# Patient Record
Sex: Male | Born: 1985
Health system: Southern US, Community
[De-identification: ages and names within clinical notes are randomized; demographics above are authoritative.]

## PROBLEM LIST (undated history)

## (undated) DIAGNOSIS — J302 Other seasonal allergic rhinitis: Secondary | ICD-10-CM

## (undated) DIAGNOSIS — I10 Essential (primary) hypertension: Secondary | ICD-10-CM

## (undated) DIAGNOSIS — L309 Dermatitis, unspecified: Secondary | ICD-10-CM

## (undated) HISTORY — DX: Dermatitis, unspecified: L30.9

---
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
## 2005-10-17 ENCOUNTER — Emergency Department (HOSPITAL_COMMUNITY): Admission: EM | Admit: 2005-10-17 | Discharge: 2005-10-17 | Payer: Self-pay | Admitting: Emergency Medicine

## 2006-07-30 ENCOUNTER — Emergency Department (HOSPITAL_COMMUNITY): Admission: EM | Admit: 2006-07-30 | Discharge: 2006-07-30 | Payer: Self-pay | Admitting: Emergency Medicine

## 2007-01-22 ENCOUNTER — Emergency Department (HOSPITAL_COMMUNITY): Admission: EM | Admit: 2007-01-22 | Discharge: 2007-01-22 | Payer: Self-pay | Admitting: Emergency Medicine

## 2007-04-18 ENCOUNTER — Emergency Department (HOSPITAL_COMMUNITY): Admission: EM | Admit: 2007-04-18 | Discharge: 2007-04-18 | Payer: Self-pay | Admitting: Emergency Medicine

## 2008-02-26 ENCOUNTER — Emergency Department (HOSPITAL_COMMUNITY): Admission: EM | Admit: 2008-02-26 | Discharge: 2008-02-26 | Payer: Self-pay | Admitting: Emergency Medicine

## 2008-03-29 ENCOUNTER — Emergency Department (HOSPITAL_COMMUNITY): Admission: EM | Admit: 2008-03-29 | Discharge: 2008-03-29 | Payer: Self-pay | Admitting: Emergency Medicine

## 2008-07-12 ENCOUNTER — Emergency Department (HOSPITAL_COMMUNITY): Admission: EM | Admit: 2008-07-12 | Discharge: 2008-07-12 | Payer: Self-pay | Admitting: Emergency Medicine

## 2008-09-29 ENCOUNTER — Emergency Department (HOSPITAL_COMMUNITY): Admission: EM | Admit: 2008-09-29 | Discharge: 2008-09-29 | Payer: Self-pay | Admitting: Obstetrics and Gynecology

## 2009-01-12 ENCOUNTER — Emergency Department (HOSPITAL_COMMUNITY): Admission: EM | Admit: 2009-01-12 | Discharge: 2009-01-12 | Payer: Self-pay | Admitting: Emergency Medicine

## 2010-05-26 ENCOUNTER — Emergency Department (HOSPITAL_COMMUNITY)
Admission: EM | Admit: 2010-05-26 | Discharge: 2010-05-26 | Disposition: A | Discharge status: Home / Self Care | Payer: BC Managed Care – PPO | Attending: Emergency Medicine | Admitting: Emergency Medicine

## 2010-05-26 DIAGNOSIS — R0989 Other specified symptoms and signs involving the circulatory and respiratory systems: Secondary | ICD-10-CM | POA: Insufficient documentation

## 2010-05-26 DIAGNOSIS — J45909 Unspecified asthma, uncomplicated: Secondary | ICD-10-CM | POA: Insufficient documentation

## 2010-05-26 DIAGNOSIS — R0602 Shortness of breath: Secondary | ICD-10-CM | POA: Insufficient documentation

## 2010-05-26 DIAGNOSIS — R0609 Other forms of dyspnea: Secondary | ICD-10-CM | POA: Insufficient documentation

## 2010-08-24 ENCOUNTER — Emergency Department (HOSPITAL_COMMUNITY)
Admission: EM | Admit: 2010-08-24 | Discharge: 2010-08-24 | Disposition: A | Discharge status: Home / Self Care | Payer: BC Managed Care – PPO | Attending: Emergency Medicine | Admitting: Emergency Medicine

## 2010-08-24 DIAGNOSIS — J45909 Unspecified asthma, uncomplicated: Secondary | ICD-10-CM | POA: Insufficient documentation

## 2010-08-24 DIAGNOSIS — R0982 Postnasal drip: Secondary | ICD-10-CM | POA: Insufficient documentation

## 2010-08-24 DIAGNOSIS — J3489 Other specified disorders of nose and nasal sinuses: Secondary | ICD-10-CM | POA: Insufficient documentation

## 2010-08-24 DIAGNOSIS — J069 Acute upper respiratory infection, unspecified: Secondary | ICD-10-CM | POA: Insufficient documentation

## 2010-09-30 ENCOUNTER — Emergency Department (HOSPITAL_COMMUNITY)
Admission: EM | Admit: 2010-09-30 | Discharge: 2010-09-30 | Disposition: A | Discharge status: Home / Self Care | Payer: BC Managed Care – PPO | Attending: Emergency Medicine | Admitting: Emergency Medicine

## 2010-09-30 DIAGNOSIS — R0602 Shortness of breath: Secondary | ICD-10-CM | POA: Insufficient documentation

## 2010-09-30 DIAGNOSIS — Z760 Encounter for issue of repeat prescription: Secondary | ICD-10-CM | POA: Insufficient documentation

## 2010-09-30 DIAGNOSIS — J45909 Unspecified asthma, uncomplicated: Secondary | ICD-10-CM | POA: Insufficient documentation

## 2010-11-14 ENCOUNTER — Inpatient Hospital Stay (INDEPENDENT_AMBULATORY_CARE_PROVIDER_SITE_OTHER)
Admission: RE | Admit: 2010-11-14 | Discharge: 2010-11-14 | Disposition: A | Discharge status: Home / Self Care | Payer: BC Managed Care – PPO | Source: Ambulatory Visit | Attending: Family Medicine | Admitting: Family Medicine

## 2010-11-14 DIAGNOSIS — J45909 Unspecified asthma, uncomplicated: Secondary | ICD-10-CM

## 2010-12-22 ENCOUNTER — Inpatient Hospital Stay (INDEPENDENT_AMBULATORY_CARE_PROVIDER_SITE_OTHER)
Admission: RE | Admit: 2010-12-22 | Discharge: 2010-12-22 | Disposition: A | Discharge status: Home / Self Care | Payer: BC Managed Care – PPO | Source: Ambulatory Visit | Attending: Family Medicine | Admitting: Family Medicine

## 2010-12-22 DIAGNOSIS — K2990 Gastroduodenitis, unspecified, without bleeding: Secondary | ICD-10-CM

## 2010-12-22 LAB — POCT H PYLORI SCREEN: H. PYLORI SCREEN, POC: NEGATIVE

## 2010-12-22 LAB — LIPASE, BLOOD: Lipase: 18 U/L (ref 11–59)

## 2010-12-22 LAB — AMYLASE: Amylase: 47 U/L (ref 0–105)

## 2011-01-02 LAB — POCT I-STAT 3, ART BLOOD GAS (G3+)
Acid-base deficit: 2
O2 Saturation: 96
Patient temperature: 97.8
pO2, Arterial: 88

## 2011-02-04 ENCOUNTER — Emergency Department (INDEPENDENT_AMBULATORY_CARE_PROVIDER_SITE_OTHER)
Admission: EM | Admit: 2011-02-04 | Discharge: 2011-02-04 | Disposition: A | Discharge status: Home / Self Care | Payer: BC Managed Care – PPO | Source: Home / Self Care | Attending: Family Medicine | Admitting: Family Medicine

## 2011-02-04 DIAGNOSIS — J45909 Unspecified asthma, uncomplicated: Secondary | ICD-10-CM

## 2011-02-04 MED ORDER — ALBUTEROL SULFATE (5 MG/ML) 0.5% IN NEBU
5.0000 mg | INHALATION_SOLUTION | Freq: Once | RESPIRATORY_TRACT | Status: AC
  Administered 2011-02-04: 5 mg via RESPIRATORY_TRACT

## 2011-02-04 MED ORDER — IPRATROPIUM BROMIDE 0.02 % IN SOLN
0.5000 mg | Freq: Once | RESPIRATORY_TRACT | Status: AC
  Administered 2011-02-04: 0.5 mg via RESPIRATORY_TRACT

## 2011-02-04 MED ORDER — ALBUTEROL SULFATE HFA 108 (90 BASE) MCG/ACT IN AERS: 2.0000 | INHALATION_SPRAY | RESPIRATORY_TRACT | Status: DC | PRN

## 2011-02-04 MED ORDER — PREDNISONE 10 MG PO TABS: 20.0000 mg | ORAL_TABLET | Freq: Two times a day (BID) | ORAL | Status: AC

## 2011-02-04 NOTE — ED Notes (Signed)
Pt is having asthma flare-up and chest tightness

## 2011-02-04 NOTE — ED Provider Notes (Signed)
History     CSN: [id number] Arrival date & time: 02/04/2011 10:17 AM   First MD Initiated Contact with Patient 02/04/11 [phone]      Chief Complaint  Patient presents with  . Asthma    (Consider location/radiation/quality/duration/timing/severity/associated sxs/prior treatment) HPI Comments: Chest tightness and wheezing - onset 2 days ago. Has been using albuterol inhaler and relieves symptoms for approx 1 hr only. Cough is productive with yellow phlegm. Minimal nasal congestion. No fever or chills.   Patient is a 25 y.o. male presenting with asthma. The history is provided by the patient.  Asthma This is a recurrent problem. The current episode started 2 days ago. The problem occurs constantly. The problem has not changed since onset.Associated symptoms include shortness of breath. Pertinent negatives include no chest pain. The symptoms are aggravated by nothing. The symptoms are relieved by medications.    Past Medical History  Diagnosis Date  . Asthma     History reviewed. No pertinent past surgical history.  History reviewed. No pertinent family history.  History  Substance Use Topics  . Smoking status: Never Smoker   . Smokeless tobacco: Not on file  . Alcohol Use: 2.4 oz/week    4 Cans of beer per week      Review of Systems  Constitutional: Negative for fever, chills and fatigue.  HENT: Positive for congestion. Negative for ear pain, sore throat, rhinorrhea, sneezing, postnasal drip and sinus pressure.   Respiratory: Positive for cough, chest tightness, shortness of breath and wheezing.   Cardiovascular: Negative for chest pain and palpitations.    Allergies  Review of patient's allergies indicates no known allergies.  Home Medications   Current Outpatient Rx  Name Route Sig Dispense Refill  . ALBUTEROL SULFATE HFA 108 (90 BASE) MCG/ACT IN AERS Inhalation Inhale 2 puffs into the lungs every 6 (six) hours as needed.        BP 141/87  Pulse 87  Temp(Src)  98.5 F (36.9 C) (Oral)  Resp 18  SpO2 97%  Physical Exam  Constitutional: He appears well-developed and well-nourished. No distress.  HENT:  Head: Normocephalic and atraumatic.  Right Ear: External ear normal.  Left Ear: External ear normal.  Mouth/Throat: Oropharynx is clear and moist. No oropharyngeal exudate.  Neck: Neck supple.  Cardiovascular: Normal rate, regular rhythm and normal heart sounds.  Exam reveals no gallop and no friction rub.   No murmur heard. Pulmonary/Chest: Effort normal. No respiratory distress. He has decreased breath sounds. He has wheezes. He has no rhonchi. He has no rales.  Lymphadenopathy:    He has no cervical adenopathy.  Psychiatric: He has a normal mood and affect. His behavior is normal.    ED Course  Procedures (including critical care time)  Labs Reviewed - No data to display No results found.   No diagnosis found.    MDM  After NMT breath sounds and wheezing improved. Pt reports symptomatic improvement.         Melody Comas, [place] 02/04/11 1227

## 2011-04-13 ENCOUNTER — Emergency Department (HOSPITAL_COMMUNITY)
Admission: EM | Admit: 2011-04-13 | Discharge: 2011-04-13 | Disposition: A | Discharge status: Home / Self Care | Payer: BC Managed Care – PPO | Source: Home / Self Care | Attending: Emergency Medicine | Admitting: Emergency Medicine

## 2011-04-13 ENCOUNTER — Encounter (HOSPITAL_COMMUNITY): Payer: Self-pay

## 2011-04-13 DIAGNOSIS — Z760 Encounter for issue of repeat prescription: Secondary | ICD-10-CM

## 2011-04-13 MED ORDER — ALBUTEROL SULFATE HFA 108 (90 BASE) MCG/ACT IN AERS
1.0000 | INHALATION_SPRAY | Freq: Four times a day (QID) | RESPIRATORY_TRACT | Status: DC | PRN
Start: 2011-04-13 — End: 2012-03-29

## 2011-04-13 NOTE — ED Notes (Signed)
Would like prescription for albuterol inhaler.  Ran out 1 1/2 weeks ago- denies current problems or sx.  Doesn't have PCP at present.

## 2011-04-13 NOTE — ED Provider Notes (Signed)
History     CSN: [id number]  Arrival date & time 04/13/11  6644   First MD Initiated Contact with Patient 04/13/11 [phone]      Chief Complaint  Patient presents with  . Medication Refill    (Consider location/radiation/quality/duration/timing/severity/associated sxs/prior treatment) HPI Comments: Need an albuterol prescription, Im fine now, but usually my asthma , kicks in during the winter time"  The history is provided by the patient.    Past Medical History  Diagnosis Date  . Asthma     History reviewed. No pertinent past surgical history.  No family history on file.  History  Substance Use Topics  . Smoking status: Never Smoker   . Smokeless tobacco: Not on file  . Alcohol Use: 2.4 oz/week    4 Cans of beer per week      Review of Systems  Constitutional: Negative for fever, chills, diaphoresis and fatigue.  HENT: Negative for congestion and rhinorrhea.   Respiratory: Negative for cough, shortness of breath and wheezing.   Cardiovascular: Negative for chest pain.    Allergies  Review of patient's allergies indicates no known allergies.  Home Medications   Current Outpatient Rx  Name Route Sig Dispense Refill  . ALBUTEROL SULFATE HFA 108 (90 BASE) MCG/ACT IN AERS Inhalation Inhale 2 puffs into the lungs every 4 (four) hours as needed for wheezing. 1 Inhaler 0  . ALBUTEROL SULFATE HFA 108 (90 BASE) MCG/ACT IN AERS Inhalation Inhale 2 puffs into the lungs every 6 (six) hours as needed.      . ALBUTEROL SULFATE HFA 108 (90 BASE) MCG/ACT IN AERS Inhalation Inhale 1-2 puffs into the lungs every 6 (six) hours as needed for wheezing. 1 Inhaler 4    BP 119/75  Pulse 74  Temp(Src) 97.6 F (36.4 C) (Oral)  Resp 16  SpO2 97%  Physical Exam  Nursing note and vitals reviewed. Constitutional: He appears well-developed and well-nourished. No distress.  HENT:  Head: Normocephalic.  Eyes: Conjunctivae are normal.  Neck: Neck supple.  Cardiovascular: Normal rate  and regular rhythm.   No murmur heard. Pulmonary/Chest: Effort normal and breath sounds normal. No respiratory distress.  Skin: No erythema.    ED Course  Procedures (including critical care time)  Labs Reviewed - No data to display No results found.   1. Medication refill       MDM  asymptomatic        Jimmie Molly, MD 04/13/11 1011

## 2011-07-27 ENCOUNTER — Encounter (HOSPITAL_COMMUNITY): Payer: Self-pay

## 2011-07-27 ENCOUNTER — Emergency Department (HOSPITAL_COMMUNITY)
Admission: EM | Admit: 2011-07-27 | Discharge: 2011-07-27 | Disposition: A | Discharge status: Home / Self Care | Payer: BC Managed Care – PPO | Source: Home / Self Care | Attending: Family Medicine | Admitting: Family Medicine

## 2011-07-27 DIAGNOSIS — S20219A Contusion of unspecified front wall of thorax, initial encounter: Secondary | ICD-10-CM

## 2011-07-27 DIAGNOSIS — J45909 Unspecified asthma, uncomplicated: Secondary | ICD-10-CM

## 2011-07-27 HISTORY — DX: Other seasonal allergic rhinitis: J30.2

## 2011-07-27 MED ORDER — ALBUTEROL SULFATE HFA 108 (90 BASE) MCG/ACT IN AERS: 1.0000 | INHALATION_SPRAY | Freq: Four times a day (QID) | RESPIRATORY_TRACT | Status: DC | PRN

## 2011-07-27 NOTE — Discharge Instructions (Signed)
Heat to sore chest and advil if needed, activity as tolerated, return as needed.

## 2011-07-27 NOTE — ED Notes (Addendum)
Impact on left lateral rib area 1 week ago, still having pain; ran out of asthma inhaler last week ; NAD

## 2011-07-27 NOTE — ED Provider Notes (Signed)
History     CSN: [id number]  Arrival date & time 07/27/11  4098   First MD Initiated Contact with Patient 07/27/11 [phone]      Chief Complaint  Patient presents with  . Rib Injury    (Consider location/radiation/quality/duration/timing/severity/associated sxs/prior treatment) Patient is a 26 y.o. male presenting with chest pain. The history is provided by the patient.  Chest Pain The chest pain began 5 - 7 days ago (struck in left chest with knee playing bball, , still sore, improving.). The chest pain is improving. Pertinent negatives for primary symptoms include no cough and no wheezing.     Past Medical History  Diagnosis Date  . Asthma   . Seasonal allergies     History reviewed. No pertinent past surgical history.  History reviewed. No pertinent family history.  History  Substance Use Topics  . Smoking status: Never Smoker   . Smokeless tobacco: Not on file  . Alcohol Use: 2.4 oz/week    4 Cans of beer per week      Review of Systems  Constitutional: Negative.   Respiratory: Negative for cough and wheezing.   Cardiovascular: Positive for chest pain.  Gastrointestinal: Negative.     Allergies  Review of patient's allergies indicates no known allergies.  Home Medications   Current Outpatient Rx  Name Route Sig Dispense Refill  . ALBUTEROL SULFATE HFA 108 (90 BASE) MCG/ACT IN AERS Inhalation Inhale 2 puffs into the lungs every 6 (six) hours as needed.      . ALBUTEROL SULFATE HFA 108 (90 BASE) MCG/ACT IN AERS Inhalation Inhale 2 puffs into the lungs every 4 (four) hours as needed for wheezing. 1 Inhaler 0  . ALBUTEROL SULFATE HFA 108 (90 BASE) MCG/ACT IN AERS Inhalation Inhale 1-2 puffs into the lungs every 6 (six) hours as needed for wheezing. 1 Inhaler 4  . ALBUTEROL SULFATE HFA 108 (90 BASE) MCG/ACT IN AERS Inhalation Inhale 1-2 puffs into the lungs every 6 (six) hours as needed for wheezing. 1 Inhaler 1    BP 142/89  Pulse 86  Temp(Src) 98.4 F (36.9  C) (Oral)  Resp 14  SpO2 98%  Physical Exam  Nursing note and vitals reviewed. Constitutional: He is oriented to person, place, and time. He appears well-developed and well-nourished. No distress.  Pulmonary/Chest: Effort normal and breath sounds normal. He exhibits tenderness.       Soreness to left costal border, no acute tenderness, no ecchymosis,abd benign.  Abdominal: Soft. Bowel sounds are normal. There is no tenderness.  Neurological: He is alert and oriented to person, place, and time.  Skin: Skin is warm and dry.    ED Course  Procedures (including critical care time)  Labs Reviewed - No data to display No results found.   1. Contusion, chest wall   2. Asthma, allergic       MDM          Linna Hoff, MD 07/27/11 1053

## 2012-01-02 ENCOUNTER — Encounter (HOSPITAL_COMMUNITY): Payer: Self-pay

## 2012-01-02 ENCOUNTER — Emergency Department (HOSPITAL_COMMUNITY)
Admission: EM | Admit: 2012-01-02 | Discharge: 2012-01-02 | Disposition: A | Discharge status: Home / Self Care | Payer: BC Managed Care – PPO | Source: Home / Self Care | Attending: Emergency Medicine | Admitting: Emergency Medicine

## 2012-01-02 DIAGNOSIS — J45909 Unspecified asthma, uncomplicated: Secondary | ICD-10-CM

## 2012-01-02 MED ORDER — IPRATROPIUM BROMIDE 0.02 % IN SOLN
0.5000 mg | Freq: Once | RESPIRATORY_TRACT | Status: AC
  Administered 2012-01-02: 0.5 mg via RESPIRATORY_TRACT

## 2012-01-02 MED ORDER — ALBUTEROL SULFATE HFA 108 (90 BASE) MCG/ACT IN AERS: 1.0000 | INHALATION_SPRAY | Freq: Four times a day (QID) | RESPIRATORY_TRACT | Status: DC | PRN

## 2012-01-02 MED ORDER — ALBUTEROL SULFATE (5 MG/ML) 0.5% IN NEBU
5.0000 mg | INHALATION_SOLUTION | Freq: Once | RESPIRATORY_TRACT | Status: AC
  Administered 2012-01-02: 5 mg via RESPIRATORY_TRACT

## 2012-01-02 MED ORDER — BECLOMETHASONE DIPROPIONATE 80 MCG/ACT IN AERS: 2.0000 | INHALATION_SPRAY | Freq: Two times a day (BID) | RESPIRATORY_TRACT | Status: DC

## 2012-01-02 MED ORDER — ALBUTEROL SULFATE (5 MG/ML) 0.5% IN NEBU
INHALATION_SOLUTION | RESPIRATORY_TRACT | Status: AC
  Filled 2012-01-02: qty 0.5

## 2012-01-02 MED ORDER — PREDNISONE 5 MG PO KIT: 1.0000 | PACK | Freq: Every day | ORAL | Status: DC

## 2012-01-02 NOTE — ED Notes (Signed)
C/o asthma and needs refill on albuterol inhaler

## 2012-01-02 NOTE — ED Provider Notes (Signed)
Chief Complaint  Patient presents with  . Asthma    History of Present Illness:   The patient is a 26 year old male with a history of asthma since childhood. He is usually maintained on albuterol on an as-needed basis, but finds himself having to use this just about every day. He had been on Advair in the distant past but discontinued it over a year ago. He felt that this did do some good but the cost was prohibitive. Over the past month and a half he's had a more difficult time breathing. He's been using his albuterol, but he knows the canister is empty and it is not doing any good. Right now he has a cough productive yellow-green sputum, sore throat, and rhinorrhea. He denies any chest pain or fever. He has had some wheezing and thinks he needs a breathing treatment right now. He has never been hospitalized for his asthma has never been on a ventilator.  Review of Systems:  Other than noted above, the patient denies any of the following symptoms. Systemic:  No fever, chills, sweats, fatigue, myalgias, headache, weight loss or anorexia. Eye:  No redness, itching, or drainage. ENT:  No earache, ear congestion, nasal congestion, sneezing, rhinorrhea, sinus pressure, sinus pain, post nasal drip, or sore throat. Lungs:  No cough, sputum production, or shortness of breath. No chest pain. GI:  No indigestion, heartburn, abdominal pain, nausea, or vomiting. Skin:  No rash or itching.  PMFSH:  Past medical history, family history, social history, meds, and allergies were reviewed.  No history of allergic rhinitis.  No use of tobacco.  Physical Exam:   Vital signs:  BP 139/90  Pulse 69  Temp 98.4 F (36.9 C) (Oral)  Resp 16  SpO2 99% General:  Alert, in no distress. Eye:  No conjunctival injection or drainage. Lids were normal. ENT:  TMs and canals were normal, without erythema or inflammation.  Nasal mucosa was clear and uncongested, without drainage.  Mucous membranes were moist.  Pharynx was  clear, without exudate or drainage.  There were no oral ulcerations or lesions. Neck:  Supple, no adenopathy, tenderness or mass. Lungs:  No retractions or use of accessory muscles.  No respiratory distress.  Lungs were clear to auscultation, without wheezes, rales or rhonchi.  Breath sounds were clear and equal bilaterally. Heart:  Regular rhythm, without gallops, murmers or rubs. Skin:  Clear, warm, and dry, without rash or lesions.  Course in Urgent Care Center:   He was given a DuoNeb breathing treatment. Breath sounds were clear both before and after the breathing treatment. He did feel a little bit better. There was no respiratory distress either before or after the breathing treatment.  Assessment:  The encounter diagnosis was Asthma.  Plan:   1.  The following meds were prescribed:   New Prescriptions   ALBUTEROL (PROVENTIL HFA;VENTOLIN HFA) 108 (90 BASE) MCG/ACT INHALER    Inhale 1-2 puffs into the lungs every 6 (six) hours as needed for wheezing.   BECLOMETHASONE (QVAR) 80 MCG/ACT INHALER    Inhale 2 puffs into the lungs 2 (two) times daily.   PREDNISONE 5 MG KIT    Take 1 kit (5 mg total) by mouth daily after breakfast. Prednisone 5 mg 6 day dosepack.  Take as directed.   2.  The patient was instructed in symptomatic care and handouts were given. 3.  The patient was told to return if becoming worse in any way, if no better in 3 or 4 days,  and given some red flag symptoms that would indicate earlier return.  Follow up:  The patient was told to follow up with a primary care physician as soon as he can find one. He was given enough medications to last for 3 months. I did is a flu vaccine this year.     Reuben Likes, MD 01/02/12 [phone]

## 2012-03-29 ENCOUNTER — Other Ambulatory Visit: Payer: Self-pay

## 2012-03-29 ENCOUNTER — Encounter (HOSPITAL_COMMUNITY): Payer: Self-pay | Admitting: *Deleted

## 2012-03-29 ENCOUNTER — Emergency Department (HOSPITAL_COMMUNITY)
Admission: EM | Admit: 2012-03-29 | Discharge: 2012-03-29 | Disposition: A | Discharge status: Home / Self Care | Payer: BC Managed Care – PPO | Attending: Emergency Medicine | Admitting: Emergency Medicine

## 2012-03-29 ENCOUNTER — Emergency Department (HOSPITAL_COMMUNITY): Payer: BC Managed Care – PPO

## 2012-03-29 DIAGNOSIS — R071 Chest pain on breathing: Secondary | ICD-10-CM | POA: Insufficient documentation

## 2012-03-29 DIAGNOSIS — R0789 Other chest pain: Secondary | ICD-10-CM

## 2012-03-29 DIAGNOSIS — Z79899 Other long term (current) drug therapy: Secondary | ICD-10-CM | POA: Insufficient documentation

## 2012-03-29 DIAGNOSIS — J45909 Unspecified asthma, uncomplicated: Secondary | ICD-10-CM | POA: Insufficient documentation

## 2012-03-29 NOTE — ED Provider Notes (Signed)
History     CSN: [id number]  Arrival date & time 03/29/12  0844   First MD Initiated Contact with Patient 03/29/12 [phone]      Chief Complaint  Patient presents with  . Chest Pain    (Consider location/radiation/quality/duration/timing/severity/associated sxs/prior treatment) HPI This 26 year old male who woke up yesterday morning with some well localized nonradiating mild chest wall tenderness with positional pain which is nonexertional and minimally pleuritic no cough no fever no body aches no bowel pain no vomiting no sweats no rash no trauma and no treatment prior to arrival. He is no associated symptoms or concerns.  His pain is been constant for over 24 hours now so he came to the emergency room for evaluation. Past Medical History  Diagnosis Date  . Asthma   . Seasonal allergies     History reviewed. No pertinent past surgical history.  History reviewed. No pertinent family history.  History  Substance Use Topics  . Smoking status: Never Smoker   . Smokeless tobacco: Not on file  . Alcohol Use: 2.4 oz/week    4 Cans of beer per week      Review of Systems 10 Systems reviewed and are negative for acute change except as noted in the HPI. Allergies  Review of patient's allergies indicates no known allergies.  Home Medications   Current Outpatient Rx  Name  Route  Sig  Dispense  Refill  . ALBUTEROL SULFATE HFA 108 (90 BASE) MCG/ACT IN AERS   Inhalation   Inhale 2 puffs into the lungs every 6 (six) hours as needed. For shortness of breath           BP 146/94  Pulse 72  Temp 98 F (36.7 C) (Oral)  Resp 18  SpO2 98%  Physical Exam  Nursing note and vitals reviewed. Constitutional:       Awake, alert, nontoxic appearance.  HENT:  Head: Atraumatic.  Eyes: Right eye exhibits no discharge. Left eye exhibits no discharge.  Neck: Neck supple.  Cardiovascular: Normal rate and regular rhythm.   No murmur heard. Pulmonary/Chest: Effort normal and breath  sounds normal. No respiratory distress. He has no wheezes. He has no rales. He exhibits tenderness.       Exactly reproducible localized left anterior chest wall tenderness without palpable deformity or rash  Abdominal: Soft. Bowel sounds are normal. He exhibits no distension and no mass. There is no tenderness. There is no rebound and no guarding.  Musculoskeletal: He exhibits no edema and no tenderness.       Baseline ROM, no obvious new focal weakness.  Neurological: He is alert.       Mental status and motor strength appears baseline for patient and situation.  Skin: No rash noted.  Psychiatric: He has a normal mood and affect.    ED Course  Procedures (including critical care time) ECG: Normal sinus rhythm, ventricular rate 67, right axis deviation, slight J-point ST elevation in leads V2 V3 and V4 no acute ischemic changes noted, no comparison ECG available  PERC negative.  Labs Reviewed  POCT I-STAT TROPONIN I   Dg Chest 2 View  03/29/2012  *RADIOLOGY REPORT*  Clinical Data: Chest pain.  Asthma.  CHEST - 2 VIEW  Comparison:  03/29/2008  Findings:  The heart size and mediastinal contours are within normal limits.  Both lungs are clear.  The visualized skeletal structures are unremarkable.  IMPRESSION: No active cardiopulmonary disease.   Original Report Authenticated By: Myles Rosenthal, M.D.  1. Chest wall pain       MDM  Patient / Family / Caregiver informed of clinical course, understand medical decision-making process, and agree with plan.I doubt any other EMC precluding discharge at this time including, but not necessarily limited to the following:ACS.        Hurman Horn, MD 03/29/12 [phone]

## 2012-03-29 NOTE — ED Notes (Signed)
Pt reports having mid chest pain that started on Thursday after etoh use. Reports mild sob, no resp distress noted. ekg done at triage.

## 2012-03-29 NOTE — ED Notes (Signed)
Pt discharged to home with family. NAD.  

## 2013-01-29 ENCOUNTER — Encounter (HOSPITAL_COMMUNITY): Payer: Self-pay | Admitting: Emergency Medicine

## 2013-01-29 ENCOUNTER — Emergency Department (HOSPITAL_COMMUNITY)
Admission: EM | Admit: 2013-01-29 | Discharge: 2013-01-29 | Disposition: A | Discharge status: Home / Self Care | Payer: BC Managed Care – PPO | Source: Home / Self Care | Attending: Family Medicine | Admitting: Family Medicine

## 2013-01-29 DIAGNOSIS — S39012A Strain of muscle, fascia and tendon of lower back, initial encounter: Secondary | ICD-10-CM

## 2013-01-29 DIAGNOSIS — J45901 Unspecified asthma with (acute) exacerbation: Secondary | ICD-10-CM

## 2013-01-29 DIAGNOSIS — S335XXA Sprain of ligaments of lumbar spine, initial encounter: Secondary | ICD-10-CM

## 2013-01-29 MED ORDER — ALBUTEROL SULFATE (5 MG/ML) 0.5% IN NEBU
INHALATION_SOLUTION | RESPIRATORY_TRACT | Status: AC
  Filled 2013-01-29: qty 1

## 2013-01-29 MED ORDER — PREDNISONE 20 MG PO TABS: 40.0000 mg | ORAL_TABLET | Freq: Every day | ORAL | Status: DC

## 2013-01-29 MED ORDER — PREDNISONE 20 MG PO TABS
60.0000 mg | ORAL_TABLET | Freq: Once | ORAL | Status: AC
  Administered 2013-01-29: 60 mg via ORAL

## 2013-01-29 MED ORDER — ALBUTEROL SULFATE HFA 108 (90 BASE) MCG/ACT IN AERS: 1.0000 | INHALATION_SPRAY | Freq: Four times a day (QID) | RESPIRATORY_TRACT | Status: DC | PRN

## 2013-01-29 MED ORDER — IBUPROFEN 800 MG PO TABS
ORAL_TABLET | ORAL | Status: AC
  Filled 2013-01-29: qty 1

## 2013-01-29 MED ORDER — ALBUTEROL SULFATE (5 MG/ML) 0.5% IN NEBU
5.0000 mg | INHALATION_SOLUTION | Freq: Once | RESPIRATORY_TRACT | Status: AC
  Administered 2013-01-29: 5 mg via RESPIRATORY_TRACT

## 2013-01-29 MED ORDER — PREDNISONE 20 MG PO TABS
ORAL_TABLET | ORAL | Status: AC
  Filled 2013-01-29: qty 3

## 2013-01-29 MED ORDER — CYCLOBENZAPRINE HCL 10 MG PO TABS: 10.0000 mg | ORAL_TABLET | Freq: Three times a day (TID) | ORAL | Status: DC | PRN

## 2013-01-29 MED ORDER — IBUPROFEN 800 MG PO TABS
800.0000 mg | ORAL_TABLET | Freq: Once | ORAL | Status: AC
  Administered 2013-01-29: 800 mg via ORAL

## 2013-01-29 NOTE — ED Provider Notes (Signed)
CSN: [id number]     Arrival date & time 01/29/13  1028 History   First MD Initiated Contact with Patient 01/29/13 1059     Chief Complaint  Patient presents with  . Medication Refill    Patient is a 27 y.o. male presenting with shortness of breath. The history is provided by the patient.  Shortness of Breath Severity:  Moderate Onset quality:  Gradual Duration:  1 week Timing:  Intermittent Progression:  Unchanged Chronicity:  Chronic Context: URI   Relieved by:  Inhaler and lying down Associated symptoms: cough, PND and wheezing   Associated symptoms: no chest pain, no ear pain and no fever   Pt reports an approx 1 week h/o increasing SOB, occasional cough and wheezing especially at night. Pt has h/o asthma and has noted increased use of his inhaler recently and is now almost out of his rescue inhaler. Pt does not know his triggers but states this is not a typical time of year for him to have problems with his asthma. However, recently did have runny nose, sneezing and mild sinus congestion w/o fever which has since resolved. Pt also reports increased LBP that he has had on and off chronically. He works at [employer] and does a lot of heavy lifting on his job. The pain is all the way across his lower back and does not radiate. Denies recent injury, numbness or tingling, LE weakness or loss of bowel or bladder.   Past Medical History  Diagnosis Date  . Asthma   . Seasonal allergies    History reviewed. No pertinent past surgical history. History reviewed. No pertinent family history. History  Substance Use Topics  . Smoking status: Current Every Day Smoker  . Smokeless tobacco: Not on file  . Alcohol Use: 2.4 oz/week    4 Cans of beer per week    Review of Systems  Constitutional: Negative.  Negative for fever.  HENT: Positive for congestion, rhinorrhea and sneezing. Negative for ear pain.   Eyes: Negative.   Respiratory: Positive for cough, shortness of breath and wheezing.    Cardiovascular: Positive for PND. Negative for chest pain.  Gastrointestinal: Negative.   Endocrine: Negative.   Genitourinary: Negative.   Musculoskeletal: Positive for back pain.  Skin: Negative.   Neurological: Negative.   Hematological: Negative.   Psychiatric/Behavioral: Negative.     Allergies  Review of patient's allergies indicates no known allergies.  Home Medications   Current Outpatient Rx  Name  Route  Sig  Dispense  Refill  . ALBUTEROL IN   Inhalation   Inhale into the lungs.         Marland Kitchen albuterol (PROVENTIL HFA;VENTOLIN HFA) 108 (90 BASE) MCG/ACT inhaler   Inhalation   Inhale 2 puffs into the lungs every 6 (six) hours as needed. For shortness of breath         . albuterol (PROVENTIL HFA;VENTOLIN HFA) 108 (90 BASE) MCG/ACT inhaler   Inhalation   Inhale 1 puff into the lungs every 6 (six) hours as needed for wheezing or shortness of breath (or cough).   1 Inhaler   1   . cyclobenzaprine (FLEXERIL) 10 MG tablet   Oral   Take 1 tablet (10 mg total) by mouth 3 (three) times daily as needed for muscle spasms (and or low back muscle pain).   20 tablet   0   . predniSONE (DELTASONE) 20 MG tablet   Oral   Take 2 tablets (40 mg total) by mouth daily.  10 tablet   0    BP 131/83  Pulse 86  Temp(Src) 98.3 F (36.8 C) (Oral)  Resp 16  SpO2 97% Physical Exam  Constitutional: He is oriented to person, place, and time. He appears well-developed and well-nourished. No distress.  HENT:  Head: Normocephalic and atraumatic.  Eyes: Conjunctivae are normal.  Neck: Neck supple.  Cardiovascular: Normal rate and regular rhythm.   Pulmonary/Chest: Effort normal. No accessory muscle usage. Not tachypneic. No respiratory distress. He has wheezes. He has no rales. He exhibits no tenderness.  Mild inspiratory and expiratory wheezing bil  Musculoskeletal: Normal range of motion.  Mild TTP to (L) and (R) lumbar paraspinal regions w/o bony TTP.   Neurological: He is  alert and oriented to person, place, and time.  Skin: Skin is warm and dry.  Psychiatric: He has a normal mood and affect.    ED Course  Procedures (including critical care time) Labs Review Labs Reviewed - No data to display Imaging Review No results found.  EKG Interpretation     Ventricular Rate:    PR Interval:    QRS Duration:   QT Interval:    QTC Calculation:   R Axis:     Text Interpretation:              MDM   1. Asthma exacerbation, mild   2. Strain of lumbar paraspinal muscle, initial encounter     1 week h/o of worsening SOB, wheezing and occasional cough w/ increased use of rescue inhaler. Mild inspir/expir wheezes on exam that resolved with albuterol neb. Will treat w/ 1 week of prednisone 40 mg QD and refill albuterol inhaler. 1 week h/o LBP that is somewhat chronic and not related to recent injury other than a job that involves heavy lifting. Will encourage regular NSAIDS x 1 week w/ PRN Flexeril and encourage pt to continue his efforts at getting established with a PCP for ongoing asthma management. Pt agreeable.    Leanne Chang, NP 01/29/13 1304  \Medical screening examination/treatment/procedure(s) were performed by a resident physician or non-physician practitioner and as the supervising physician I was immediately available for consultation/collaboration.  Clementeen Graham, MD    Rodolph Bong, MD 02/01/13 [phone]

## 2013-01-29 NOTE — ED Notes (Signed)
Patient requesting refill of albuterol inhaler.  Patient also c/o lower back pain.  Onset one week ago

## 2013-04-01 ENCOUNTER — Encounter (HOSPITAL_COMMUNITY): Payer: Self-pay | Admitting: Emergency Medicine

## 2013-04-01 ENCOUNTER — Emergency Department (HOSPITAL_COMMUNITY)
Admission: EM | Admit: 2013-04-01 | Discharge: 2013-04-01 | Disposition: A | Discharge status: Home / Self Care | Payer: BC Managed Care – PPO | Source: Home / Self Care | Attending: Family Medicine | Admitting: Family Medicine

## 2013-04-01 DIAGNOSIS — J45901 Unspecified asthma with (acute) exacerbation: Secondary | ICD-10-CM

## 2013-04-01 DIAGNOSIS — J45909 Unspecified asthma, uncomplicated: Secondary | ICD-10-CM

## 2013-04-01 DIAGNOSIS — J45998 Other asthma: Secondary | ICD-10-CM | POA: Diagnosis present

## 2013-04-01 MED ORDER — BECLOMETHASONE DIPROPIONATE 80 MCG/ACT IN AERS: 2.0000 | INHALATION_SPRAY | Freq: Two times a day (BID) | RESPIRATORY_TRACT | Status: DC

## 2013-04-01 MED ORDER — ALBUTEROL SULFATE HFA 108 (90 BASE) MCG/ACT IN AERS: 1.0000 | INHALATION_SPRAY | Freq: Four times a day (QID) | RESPIRATORY_TRACT | Status: DC | PRN

## 2013-04-01 MED ORDER — PREDNISONE 50 MG PO TABS: 50.0000 mg | ORAL_TABLET | Freq: Every day | ORAL | Status: DC

## 2013-04-01 NOTE — ED Notes (Signed)
C/o medication refill States he needs a refill on albuterol inhaler.

## 2013-04-01 NOTE — ED Provider Notes (Signed)
William Zimmerman is a 27 y.o. male who presents to Urgent Care today for refill of albuterol inhaler. Patient has a history of asthma. He currently is only taking albuterol. He uses this medicine on average 4 times a day and several times at night. He has been on controller medication in the past but is not on any currently. He does have health insurance but does not have a primary care provider. He is feeling somewhat "tight" today and is about to run out of his albuterol. No nausea vomiting or diarrhea. No current trouble breathing.   Past Medical History  Diagnosis Date  . Asthma   . Seasonal allergies    History  Substance Use Topics  . Smoking status: Current Every Day Smoker  . Smokeless tobacco: Not on file  . Alcohol Use: 2.4 oz/week    4 Cans of beer per week   ROS as above Medications reviewed. No current facility-administered medications for this encounter.   Current Outpatient Prescriptions  Medication Sig Dispense Refill  . albuterol (PROVENTIL HFA;VENTOLIN HFA) 108 (90 BASE) MCG/ACT inhaler Inhale 1 puff into the lungs every 6 (six) hours as needed for wheezing or shortness of breath (or cough).  1 Inhaler  1  . beclomethasone (QVAR) 80 MCG/ACT inhaler Inhale 2 puffs into the lungs 2 (two) times daily.  1 Inhaler  12  . predniSONE (DELTASONE) 50 MG tablet Take 1 tablet (50 mg total) by mouth daily.  5 tablet  0  . [DISCONTINUED] ALBUTEROL IN Inhale into the lungs.        Exam:  BP 149/95  Pulse 62  Temp(Src) 98.4 F (36.9 C) (Oral)  Resp 18  SpO2 100% Gen: Well NAD HEENT:   MMM Lungs: Normal work of breathing. Prolonged respiratory phase without wheezing bilaterally. Good air motion Heart: RRR no MRG Exts: , warm and well perfused.   Assessment and Plan: 27 y.o. male with asthma exacerbation of the setting of poorly controlled underlying asthma. We discussed the importance of controller medication. Will use prednisone burst, refill albuterol. Additionally we'll  prescribe Qvar. Patient is to followup with a primary care provider soon for this issue.  Discussed warning signs or symptoms. Please see discharge instructions. Patient expresses understanding.      Rodolph Bong, MD 04/01/13 [phone]

## 2013-07-17 ENCOUNTER — Encounter (HOSPITAL_COMMUNITY): Payer: Self-pay | Admitting: Emergency Medicine

## 2013-07-17 ENCOUNTER — Emergency Department (HOSPITAL_COMMUNITY)
Admission: EM | Admit: 2013-07-17 | Discharge: 2013-07-17 | Disposition: A | Discharge status: Home / Self Care | Payer: BC Managed Care – PPO | Source: Home / Self Care | Attending: Family Medicine | Admitting: Family Medicine

## 2013-07-17 DIAGNOSIS — J45909 Unspecified asthma, uncomplicated: Secondary | ICD-10-CM

## 2013-07-17 MED ORDER — TRIAMCINOLONE ACETONIDE 40 MG/ML IJ SUSP
40.0000 mg | Freq: Once | INTRAMUSCULAR | Status: AC
  Administered 2013-07-17: 40 mg via INTRAMUSCULAR

## 2013-07-17 MED ORDER — ALBUTEROL SULFATE HFA 108 (90 BASE) MCG/ACT IN AERS: 1.0000 | INHALATION_SPRAY | Freq: Four times a day (QID) | RESPIRATORY_TRACT | Status: DC | PRN

## 2013-07-17 MED ORDER — METHYLPREDNISOLONE ACETATE 80 MG/ML IJ SUSP
INTRAMUSCULAR | Status: AC
  Filled 2013-07-17: qty 1

## 2013-07-17 MED ORDER — METHYLPREDNISOLONE ACETATE 40 MG/ML IJ SUSP
80.0000 mg | Freq: Once | INTRAMUSCULAR | Status: AC
  Administered 2013-07-17: 80 mg via INTRAMUSCULAR

## 2013-07-17 MED ORDER — TRIAMCINOLONE ACETONIDE 40 MG/ML IJ SUSP
INTRAMUSCULAR | Status: AC
  Filled 2013-07-17: qty 1

## 2013-07-17 NOTE — ED Notes (Signed)
Pt  Is  Here  For  A  Refill  Of  His  Albuterol         Inhaler  He  Ran out  And  And  Has  No PCP

## 2013-07-17 NOTE — ED Provider Notes (Signed)
CSN: [id number]     Arrival date & time 07/17/13  0801 History   First MD Initiated Contact with Patient 07/17/13 [phone]     Chief Complaint  Patient presents with  . Medication Refill   (Consider location/radiation/quality/duration/timing/severity/associated sxs/prior Treatment) Patient is a 28 y.o. male presenting with shortness of breath. The history is provided by the patient.  Shortness of Breath Severity:  Mild Chronicity:  Chronic Context: pollens and weather changes   Context comment:  Here for albuterol refill, asthma worse this time of yr with spring pollen, currently doing well.   Past Medical History  Diagnosis Date  . Asthma   . Seasonal allergies    History reviewed. No pertinent past surgical history. History reviewed. No pertinent family history. History  Substance Use Topics  . Smoking status: Current Every Day Smoker  . Smokeless tobacco: Not on file  . Alcohol Use: 2.4 oz/week    4 Cans of beer per week    Review of Systems  Constitutional: Negative.   Respiratory: Positive for shortness of breath.     Allergies  Review of patient's allergies indicates no known allergies.  Home Medications   Prior to Admission medications   Medication Sig Start Date End Date Taking? Authorizing Provider  albuterol (PROVENTIL HFA;VENTOLIN HFA) 108 (90 BASE) MCG/ACT inhaler Inhale 1 puff into the lungs every 6 (six) hours as needed for wheezing or shortness of breath (or cough). 04/01/13   Rodolph BongEvan S Corey, MD  albuterol (PROVENTIL HFA;VENTOLIN HFA) 108 (90 BASE) MCG/ACT inhaler Inhale 1-2 puffs into the lungs every 6 (six) hours as needed for wheezing or shortness of breath. 07/17/13   Linna HoffJames D [place], MD  beclomethasone (QVAR) 80 MCG/ACT inhaler Inhale 2 puffs into the lungs 2 (two) times daily. 04/01/13   Rodolph BongEvan S Corey, MD  predniSONE (DELTASONE) 50 MG tablet Take 1 tablet (50 mg total) by mouth daily. 04/01/13   Rodolph BongEvan S Corey, MD   BP 149/93  Pulse 78  Temp(Src) 98.1 F  (36.7 C) (Oral)  Resp 16  SpO2 98% Physical Exam  Nursing note and vitals reviewed. Constitutional: He is oriented to person, place, and time. He appears well-developed and well-nourished. No distress.  HENT:  Mouth/Throat: Oropharynx is clear and moist.  Neck: Normal range of motion. Neck supple.  Cardiovascular: Regular rhythm and normal heart sounds.   Pulmonary/Chest: Effort normal and breath sounds normal.  Lymphadenopathy:    He has no cervical adenopathy.  Neurological: He is alert and oriented to person, place, and time.  Skin: Skin is warm and dry.    ED Course  Procedures (including critical care time) Labs Review Labs Reviewed - No data to display  Results for orders placed during the hospital encounter of 03/29/12  POCT I-STAT TROPONIN I      Result Value Ref Range   Troponin i, poc 0.00  0.00 - 0.08 ng/mL   Comment 3            Imaging Review No results found.   MDM   1. Asthma due to seasonal allergies       Linna HoffJames D Glennette Galster, MD 07/17/13 [phone]

## 2013-11-13 ENCOUNTER — Encounter (HOSPITAL_COMMUNITY): Payer: Self-pay | Admitting: Emergency Medicine

## 2013-11-13 ENCOUNTER — Emergency Department (HOSPITAL_COMMUNITY)
Admission: EM | Admit: 2013-11-13 | Discharge: 2013-11-13 | Disposition: A | Discharge status: Home / Self Care | Payer: BC Managed Care – PPO | Source: Home / Self Care | Attending: Family Medicine | Admitting: Family Medicine

## 2013-11-13 DIAGNOSIS — J45909 Unspecified asthma, uncomplicated: Secondary | ICD-10-CM

## 2013-11-13 MED ORDER — METHYLPREDNISOLONE ACETATE 80 MG/ML IJ SUSP
INTRAMUSCULAR | Status: AC
  Filled 2013-11-13: qty 1

## 2013-11-13 MED ORDER — ALBUTEROL SULFATE HFA 108 (90 BASE) MCG/ACT IN AERS: 1.0000 | INHALATION_SPRAY | Freq: Four times a day (QID) | RESPIRATORY_TRACT | Status: DC | PRN

## 2013-11-13 MED ORDER — METHYLPREDNISOLONE ACETATE 40 MG/ML IJ SUSP
80.0000 mg | Freq: Once | INTRAMUSCULAR | Status: AC
  Administered 2013-11-13: 80 mg via INTRAMUSCULAR

## 2013-11-13 NOTE — ED Provider Notes (Signed)
CSN: [id number]     Arrival date & time 11/13/13  [id number] History   First MD Initiated Contact with Patient 11/13/13 1001     Chief Complaint  Patient presents with  . Medication Refill   (Consider location/radiation/quality/duration/timing/severity/associated sxs/prior Treatment) Patient is a 28 y.o. male presenting with shortness of breath. The history is provided by the patient.  Shortness of Breath Severity:  Mild Onset quality:  Gradual Progression:  Resolved Chronicity:  Chronic Context: weather changes   Context comment:  Chronic asthma, uses inhaler nightly , needs rx refill. no resp difficulty at present. Relieved by:  None tried Worsened by:  Nothing tried Ineffective treatments:  None tried Associated symptoms: wheezing     Past Medical History  Diagnosis Date  . Asthma   . Seasonal allergies    History reviewed. No pertinent past surgical history. History reviewed. No pertinent family history. History  Substance Use Topics  . Smoking status: Former Games developermoker  . Smokeless tobacco: Not on file  . Alcohol Use: 2.4 oz/week    4 Cans of beer per week    Review of Systems  Constitutional: Negative.   HENT: Negative.   Respiratory: Positive for wheezing. Negative for shortness of breath.   Cardiovascular: Negative.   Gastrointestinal: Negative.     Allergies  Review of patient's allergies indicates no known allergies.  Home Medications   Prior to Admission medications   Medication Sig Start Date End Date Taking? Authorizing Provider  albuterol (PROVENTIL HFA;VENTOLIN HFA) 108 (90 BASE) MCG/ACT inhaler Inhale 1 puff into the lungs every 6 (six) hours as needed for wheezing or shortness of breath (or cough). 04/01/13   Rodolph BongEvan S Corey, MD  albuterol (PROVENTIL HFA;VENTOLIN HFA) 108 (90 BASE) MCG/ACT inhaler Inhale 1-2 puffs into the lungs every 6 (six) hours as needed for wheezing or shortness of breath. 07/17/13   Linna HoffJames D Jasminemarie Sherrard, MD  albuterol (PROVENTIL HFA;VENTOLIN  HFA) 108 (90 BASE) MCG/ACT inhaler Inhale 1-2 puffs into the lungs every 6 (six) hours as needed for wheezing or shortness of breath. 11/13/13   Linna HoffJames D Ananda Caya, MD  albuterol (PROVENTIL HFA;VENTOLIN HFA) 108 (90 BASE) MCG/ACT inhaler Inhale 1-2 puffs into the lungs every 6 (six) hours as needed for wheezing or shortness of breath. 11/13/13   Linna HoffJames D Chinedum Vanhouten, MD  beclomethasone (QVAR) 80 MCG/ACT inhaler Inhale 2 puffs into the lungs 2 (two) times daily. 04/01/13   Rodolph BongEvan S Corey, MD  predniSONE (DELTASONE) 50 MG tablet Take 1 tablet (50 mg total) by mouth daily. 04/01/13   Rodolph BongEvan S Corey, MD   BP 129/93  Pulse 74  Temp(Src) 97.8 F (36.6 C) (Oral)  Resp 17  SpO2 98% Physical Exam  Nursing note and vitals reviewed. Constitutional: He is oriented to person, place, and time. He appears well-developed and well-nourished.  HENT:  Right Ear: External ear normal.  Left Ear: External ear normal.  Mouth/Throat: Oropharynx is clear and moist.  Neck: Normal range of motion. Neck supple.  Cardiovascular: Regular rhythm and normal heart sounds.   Pulmonary/Chest: Effort normal and breath sounds normal. He has no wheezes.  Neurological: He is alert and oriented to person, place, and time.  Skin: Skin is warm and dry.    ED Course  Procedures (including critical care time) Labs Review Labs Reviewed - No data to display  Imaging Review No results found.   MDM   1. Asthma in adult without complication        Linna HoffJames D Donnell Wion, MD 11/13/13  1457 

## 2013-11-13 NOTE — ED Notes (Signed)
Pt  Is  Here  For  A  Medication  Refill     He  Is  Out  Of  His  Inhaler           He is  Awake               And  Alert  And  Oriented

## 2014-01-29 ENCOUNTER — Encounter (HOSPITAL_COMMUNITY): Payer: Self-pay | Admitting: Emergency Medicine

## 2014-01-29 ENCOUNTER — Emergency Department (HOSPITAL_COMMUNITY)
Admission: EM | Admit: 2014-01-29 | Discharge: 2014-01-29 | Disposition: A | Discharge status: Home / Self Care | Payer: BC Managed Care – PPO | Source: Home / Self Care | Attending: Family Medicine | Admitting: Family Medicine

## 2014-01-29 DIAGNOSIS — J4530 Mild persistent asthma, uncomplicated: Secondary | ICD-10-CM

## 2014-01-29 MED ORDER — ALBUTEROL SULFATE HFA 108 (90 BASE) MCG/ACT IN AERS: 2.0000 | INHALATION_SPRAY | RESPIRATORY_TRACT | Status: DC | PRN

## 2014-01-29 NOTE — ED Provider Notes (Signed)
Medical screening examination/treatment/procedure(s) were performed by resident physician or non-physician practitioner and as supervising physician I was immediately available for consultation/collaboration.   Barkley BrunsKINDL,Nedda Gains DOUGLAS MD.   Linna HoffJames D Catelin Manthe, MD 01/29/14 [phone]

## 2014-01-29 NOTE — ED Provider Notes (Signed)
CSN: [id number]     Arrival date & time 01/29/14  [id number] History   First MD Initiated Contact with Patient 01/29/14 0915     Chief Complaint  Patient presents with  . Medication Refill   (Consider location/radiation/quality/duration/timing/severity/associated sxs/prior Treatment) HPI       28 year old male presents requesting a refill of his albuterol inhaler. He has asthma, he does not have a primary care doctor. He has Qvar that he uses but he is not out of that. He has been using the albuterol inhaler daily. Currently he has no wheezing or shortness of breath. He has a primary care appointment set up for next week. However he ran out of his inhaler before he can get there.   Past Medical History  Diagnosis Date  . Asthma   . Seasonal allergies    History reviewed. No pertinent past surgical history. History reviewed. No pertinent family history. History  Substance Use Topics  . Smoking status: Former Games developermoker  . Smokeless tobacco: Not on file  . Alcohol Use: 2.4 oz/week    4 Cans of beer per week    Review of Systems  Constitutional: Negative for fever, chills and fatigue.  HENT: Negative for sore throat.   Eyes: Negative for visual disturbance.  Respiratory: Positive for wheezing Antony Madura([place], but none currently.). Negative for cough and shortness of breath.   Cardiovascular: Negative for chest pain, palpitations and leg swelling.  Gastrointestinal: Negative for nausea, vomiting, abdominal pain, diarrhea and constipation.  Genitourinary: Negative for dysuria, urgency, frequency and hematuria.  Musculoskeletal: Negative for arthralgias, myalgias, neck pain and neck stiffness.  Skin: Negative for rash.  Neurological: Negative for dizziness, weakness and light-headedness.  All other systems reviewed and are negative.   Allergies  Review of patient's allergies indicates no known allergies.  Home Medications   Prior to Admission medications   Medication Sig Start Date End Date  Taking? Authorizing Provider  albuterol (PROVENTIL HFA;VENTOLIN HFA) 108 (90 BASE) MCG/ACT inhaler Inhale 1 puff into the lungs every 6 (six) hours as needed for wheezing or shortness of breath (or cough). 04/01/13   Rodolph BongEvan S Corey, MD  albuterol (PROVENTIL HFA;VENTOLIN HFA) 108 (90 BASE) MCG/ACT inhaler Inhale 1-2 puffs into the lungs every 6 (six) hours as needed for wheezing or shortness of breath. 07/17/13   Linna HoffJames D Kindl, MD  albuterol (PROVENTIL HFA;VENTOLIN HFA) 108 (90 BASE) MCG/ACT inhaler Inhale 1-2 puffs into the lungs every 6 (six) hours as needed for wheezing or shortness of breath. 11/13/13   Linna HoffJames D Kindl, MD  albuterol (PROVENTIL HFA;VENTOLIN HFA) 108 (90 BASE) MCG/ACT inhaler Inhale 1-2 puffs into the lungs every 6 (six) hours as needed for wheezing or shortness of breath. 11/13/13   Linna HoffJames D Kindl, MD  albuterol (PROVENTIL HFA;VENTOLIN HFA) 108 (90 BASE) MCG/ACT inhaler Inhale 2 puffs into the lungs every 4 (four) hours as needed for wheezing. 01/29/14   Graylon GoodZachary H Michelle Vanhise, PA-C  beclomethasone (QVAR) 80 MCG/ACT inhaler Inhale 2 puffs into the lungs 2 (two) times daily. 04/01/13   Rodolph BongEvan S Corey, MD  predniSONE (DELTASONE) 50 MG tablet Take 1 tablet (50 mg total) by mouth daily. 04/01/13   Rodolph BongEvan S Corey, MD   BP 142/94  Pulse 64  Temp(Src) 97.9 F (36.6 C) (Oral)  Resp 16  SpO2 96% Physical Exam  Nursing note and vitals reviewed. Constitutional: He is oriented to person, place, and time. He appears well-developed and well-nourished. No distress.  HENT:  Head: Normocephalic.  Cardiovascular: Normal rate,  regular rhythm and normal heart sounds.   Pulmonary/Chest: Effort normal and breath sounds normal. No respiratory distress. He has no wheezes. He has no rales.  Neurological: He is alert and oriented to person, place, and time. Coordination normal.  Skin: Skin is warm and dry. No rash noted. He is not diaphoretic.  Psychiatric: He has a normal mood and affect. Judgment normal.    ED  Course  Procedures (including critical care time) Labs Review Labs Reviewed - No data to display  Imaging Review No results found.   MDM   1. Asthma, mild persistent, uncomplicated    Inhaler refilled. Follow-up with primary care.   Meds ordered this encounter  Medications  . albuterol (PROVENTIL HFA;VENTOLIN HFA) 108 (90 BASE) MCG/ACT inhaler    Sig: Inhale 2 puffs into the lungs every 4 (four) hours as needed for wheezing.    Dispense:  1 Inhaler    Refill:  2    Order Specific Question:  Supervising Provider    Answer:  Bradd CanaryKINDL, JAMES D [[id number]]       Graylon GoodZachary H Baruc Tugwell, PA-C 01/29/14 0930

## 2014-01-29 NOTE — ED Notes (Signed)
Pt  Is requsting  A  Refill of  His albuterol  Inhaler   For  Asthma symptoms

## 2014-01-29 NOTE — Discharge Instructions (Signed)

## 2014-02-05 ENCOUNTER — Ambulatory Visit (INDEPENDENT_AMBULATORY_CARE_PROVIDER_SITE_OTHER): Payer: BC Managed Care – PPO | Admitting: Family Medicine

## 2014-02-05 ENCOUNTER — Encounter: Payer: Self-pay | Admitting: Family Medicine

## 2014-02-05 VITALS — BP 148/86 | HR 84 | Temp 98.6°F | Ht 72.0 in | Wt 200.2 lb

## 2014-02-05 DIAGNOSIS — Z23 Encounter for immunization: Secondary | ICD-10-CM

## 2014-02-05 DIAGNOSIS — J4530 Mild persistent asthma, uncomplicated: Secondary | ICD-10-CM

## 2014-02-05 MED ORDER — BECLOMETHASONE DIPROPIONATE 80 MCG/ACT IN AERS: 2.0000 | INHALATION_SPRAY | Freq: Two times a day (BID) | RESPIRATORY_TRACT | Status: DC

## 2014-02-05 MED ORDER — IPRATROPIUM-ALBUTEROL 0.5-2.5 (3) MG/3ML IN SOLN
3.0000 mL | Freq: Once | RESPIRATORY_TRACT | Status: AC
  Administered 2014-02-05: 3 mL via RESPIRATORY_TRACT

## 2014-02-05 MED ORDER — ALBUTEROL SULFATE HFA 108 (90 BASE) MCG/ACT IN AERS: 2.0000 | INHALATION_SPRAY | RESPIRATORY_TRACT | Status: DC | PRN

## 2014-02-05 NOTE — Patient Instructions (Signed)

## 2014-02-05 NOTE — Progress Notes (Signed)
Pre visit review using our clinic review tool, if applicable. No additional management support is needed unless otherwise documented below in the visit note. 

## 2014-02-05 NOTE — Progress Notes (Signed)
  Subjective:     William Zimmerman is a 28 y.o. male who presents for evaluation of asthma. The patient has been previously diagnosed with asthma. @[facility] @ was diagnosed with asthma at age 28 . The patient has never been admitted to the hospital for asthma. The patient is not currently have symptoms / an exacerbation. The patient has been having episodes for approximately 1 month. Symptoms in previous episodes have included dyspnea, non-productive cough and wheezing, and typically last few minutes. Previous episodes have been triggered by dust, fumes, infection, pollens, smoke, strong odors and upper respiratory infection. Treatments tried during prior episodes include inhaled corticosteroids and short-acting inhaled beta-adrenergic agonists, which usually provides complete resolution of symptoms.   Current Disease Severity William Zimmerman has no daytime asthma symptoms. He has frequent nighttime asthma symptoms. The patient is using short-acting beta agonists for symptom control more than 2 days per week but not more than once a day. He has exacerbations requiring oral systemic corticosteroids 1 times per year. Current limitations in activity from asthma: none. Number of days of school or work missed in the last month: 0. Number of urgent/emergent visit in last year: 3   The following portions of the patient's history were reviewed and updated as appropriate:  He  has a past medical history of Asthma and Seasonal allergies. He  does not have any pertinent problems on file. He  has no past surgical history on file. His family history includes Arthritis in his maternal grandfather and mother; Diabetes in his maternal grandfather and mother. He  reports that he has been smoking.  He has never used smokeless tobacco. He reports that he drinks about 2.4 oz of alcohol per week. He reports that he uses illicit drugs (Marijuana). He has a current medication list which includes the following prescription(s): albuterol and  beclomethasone. Current Outpatient Prescriptions on File Prior to Visit  Medication Sig Dispense Refill  . [DISCONTINUED] ALBUTEROL IN Inhale into the lungs.     No current facility-administered medications on file prior to visit.   He has No Known Allergies..  Review of Systems Pertinent items are noted in HPI.    Objective:    Oxygen saturation 97% on room air BP 148/86 mmHg  Pulse 84  Temp(Src) 98.6 F (37 C) (Oral)  Ht 6' (1.829 m)  Wt 200 lb 3.2 oz (90.81 kg)  BMI 27.15 kg/m2  SpO2 97% General appearance: alert, cooperative, appears stated age and no distress Ears: normal TM's and external ear canals both ears Nose: Nares normal. Septum midline. Mucosa normal. No drainage or sinus tenderness. Throat: lips, mucosa, and tongue normal; teeth and gums normal Neck: no adenopathy, supple, symmetrical, trachea midline and thyroid not enlarged, symmetric, no tenderness/mass/nodules Lungs: wheezes bilaterally cleared with duoneb Heart: S1, S2 normal Extremities: extremities normal, atraumatic, no cyanosis or edema    Assessment:    Mild persistent asthma. Apparent precipitants include dust, fumes, pollens, smoke and strong odors. Treatment with duoneb given in office    Medications: no change. Beta-agonist nebulizer treatment given in the office with duoneb relief of symptoms. Discussed distinction between quick-relief and controlled medications. Discussed medication dosage, use, side effects, and goals of treatment in detail.   Smoking cessation efforts: discussed with pt Asthma information handout given. Influenza vaccine given.    rto for cpe

## 2014-02-08 ENCOUNTER — Telehealth: Payer: Self-pay | Admitting: Family Medicine

## 2014-02-08 NOTE — Telephone Encounter (Signed)
emmi emailed °

## 2014-04-23 ENCOUNTER — Ambulatory Visit: Payer: BC Managed Care – PPO | Admitting: Family Medicine

## 2014-06-23 ENCOUNTER — Telehealth: Payer: Self-pay | Admitting: Family Medicine

## 2014-06-23 DIAGNOSIS — J4530 Mild persistent asthma, uncomplicated: Secondary | ICD-10-CM

## 2014-06-23 MED ORDER — ALBUTEROL SULFATE HFA 108 (90 BASE) MCG/ACT IN AERS: 2.0000 | INHALATION_SPRAY | RESPIRATORY_TRACT | Status: DC | PRN

## 2014-06-23 NOTE — Telephone Encounter (Signed)
Caller name: Teodoro SprayCollier, Eliza Relation to pt: self  Call back number: [phone] [id number] [place], [place] - [address] [phone] (Phone) [phone] (Fax)            Reason for call:  Pt requesting a refill albuterol (PROVENTIL HFA;VENTOLIN HFA) 108 (90 BASE) MCG/ACT inhaler

## 2014-06-23 NOTE — Telephone Encounter (Signed)
Rx faxed.    KP 

## 2014-08-13 ENCOUNTER — Telehealth: Payer: Self-pay | Admitting: *Deleted

## 2014-08-13 NOTE — Telephone Encounter (Signed)
Received signed release of records for HTN and medical exam form via fax from [employer]. We have never seen pt for HTN. He will need to make an appt. I called and lm for pt to please call back.   When pt calls back, please schedule an appt.

## 2014-08-24 ENCOUNTER — Telehealth: Payer: Self-pay | Admitting: Family Medicine

## 2014-08-24 DIAGNOSIS — J4530 Mild persistent asthma, uncomplicated: Secondary | ICD-10-CM

## 2014-08-24 MED ORDER — ALBUTEROL SULFATE HFA 108 (90 BASE) MCG/ACT IN AERS: 2.0000 | INHALATION_SPRAY | RESPIRATORY_TRACT | Status: DC | PRN

## 2014-08-24 NOTE — Telephone Encounter (Signed)
CRelation to pt: self  Call back number: [phone]   Reason for call:  Pt requesting a refill albuterol (PROVENTIL HFA;VENTOLIN HFA) 108 (90 BASE) MCG/ACT inhaler

## 2014-08-24 NOTE — Telephone Encounter (Signed)
Rx faxed to pharmacy on file.   KP

## 2014-11-19 ENCOUNTER — Other Ambulatory Visit: Payer: Self-pay | Admitting: Family Medicine

## 2014-12-16 ENCOUNTER — Telehealth: Payer: Self-pay | Admitting: Family Medicine

## 2014-12-16 MED ORDER — ALBUTEROL SULFATE HFA 108 (90 BASE) MCG/ACT IN AERS: INHALATION_SPRAY | RESPIRATORY_TRACT | Status: DC

## 2014-12-16 NOTE — Telephone Encounter (Signed)
Apt in Nov. Rx faxed.     KP

## 2014-12-16 NOTE — Telephone Encounter (Signed)
William Zimmerman - pt wife Best # [phone]  Pt needing refill on Proair inhaler. States it is empty. Please send to [facility].

## 2015-01-04 ENCOUNTER — Telehealth: Payer: Self-pay | Admitting: Family Medicine

## 2015-01-04 MED ORDER — ALBUTEROL SULFATE HFA 108 (90 BASE) MCG/ACT IN AERS: INHALATION_SPRAY | RESPIRATORY_TRACT | Status: DC

## 2015-01-04 NOTE — Telephone Encounter (Signed)
Apt pending.  Rx faxed.     KP

## 2015-01-04 NOTE — Telephone Encounter (Signed)
Pt needing refill on albuterol inhaler. He said he is out. Pt ph # [phone] Pharmacy: [facility]

## 2015-01-24 ENCOUNTER — Telehealth: Payer: Self-pay | Admitting: Family Medicine

## 2015-01-24 MED ORDER — ALBUTEROL SULFATE HFA 108 (90 BASE) MCG/ACT IN AERS: INHALATION_SPRAY | RESPIRATORY_TRACT | Status: DC

## 2015-01-24 NOTE — Telephone Encounter (Signed)
I tried to call the patient but his number was not a working number.

## 2015-01-24 NOTE — Telephone Encounter (Signed)
Pt called for refill on albuterol (PROAIR HFA) 108 (90 BASE) MCG/ACT inhaler. States his is empty.  [id number] - [place], [place] - [address] AT [address]

## 2015-02-14 ENCOUNTER — Telehealth: Payer: Self-pay

## 2015-02-14 MED ORDER — ALBUTEROL SULFATE HFA 108 (90 BASE) MCG/ACT IN AERS: INHALATION_SPRAY | RESPIRATORY_TRACT | Status: DC

## 2015-02-14 NOTE — Telephone Encounter (Signed)
Pending apt 02/18/15 . Rx faxed.    KP

## 2015-02-14 NOTE — Telephone Encounter (Signed)
  Relationship to patient: Self  Pharmacy:[facility] [id number] - [place], [place] - [address] AT [address] [phone]  Reason for call: Patient wants a refill of albuterol

## 2015-02-18 ENCOUNTER — Encounter: Payer: Self-pay | Admitting: Family Medicine

## 2015-02-18 ENCOUNTER — Encounter (INDEPENDENT_AMBULATORY_CARE_PROVIDER_SITE_OTHER): Payer: Self-pay

## 2015-02-18 ENCOUNTER — Ambulatory Visit (INDEPENDENT_AMBULATORY_CARE_PROVIDER_SITE_OTHER): Payer: BLUE CROSS/BLUE SHIELD | Admitting: Family Medicine

## 2015-02-18 VITALS — BP 138/90 | HR 66 | Temp 98.1°F | Ht 72.0 in | Wt 220.0 lb

## 2015-02-18 DIAGNOSIS — J4530 Mild persistent asthma, uncomplicated: Secondary | ICD-10-CM | POA: Diagnosis not present

## 2015-02-18 DIAGNOSIS — Z0000 Encounter for general adult medical examination without abnormal findings: Secondary | ICD-10-CM | POA: Diagnosis not present

## 2015-02-18 DIAGNOSIS — J4520 Mild intermittent asthma, uncomplicated: Secondary | ICD-10-CM

## 2015-02-18 DIAGNOSIS — Z23 Encounter for immunization: Secondary | ICD-10-CM | POA: Diagnosis not present

## 2015-02-18 MED ORDER — ALBUTEROL SULFATE HFA 108 (90 BASE) MCG/ACT IN AERS: INHALATION_SPRAY | RESPIRATORY_TRACT | Status: DC

## 2015-02-18 NOTE — Progress Notes (Signed)
Pre visit review using our clinic review tool, if applicable. No additional management support is needed unless otherwise documented below in the visit note. 

## 2015-02-18 NOTE — Patient Instructions (Addendum)
Preventive Care for Adults, Male A healthy lifestyle and preventive care can promote health and wellness. Preventive health guidelines for men include the following key practices:  A routine yearly physical is a good way to check with your health care provider about your health and preventative screening. It is a chance to share any concerns and updates on your health and to receive a thorough exam.  Visit your dentist for a routine exam and preventative care every 6 months. Brush your teeth twice a day and floss once a day. Good oral hygiene prevents tooth decay and gum disease.  The frequency of eye exams is based on your age, health, family medical history, use of contact lenses, and other factors. Follow your health care provider's recommendations for frequency of eye exams.  Eat a healthy diet. Foods such as vegetables, fruits, whole grains, low-fat dairy products, and lean protein foods contain the nutrients you need without too many calories. Decrease your intake of foods high in solid fats, added sugars, and salt. Eat the right amount of calories for you.Get information about a proper diet from your health care provider, if necessary.  Regular physical exercise is one of the most important things you can do for your health. Most adults should get at least 150 minutes of moderate-intensity exercise (any activity that increases your heart rate and causes you to sweat) each week. In addition, most adults need muscle-strengthening exercises on 2 or more days a week.  Maintain a healthy weight. The body mass index (BMI) is a screening tool to identify possible weight problems. It provides an estimate of body fat based on height and weight. Your health care provider can find your BMI and can help you achieve or maintain a healthy weight.For adults 20 years and older:  A BMI below 18.5 is considered underweight.  A BMI of 18.5 to 24.9 is normal.  A BMI of 25 to 29.9 is considered  overweight.  A BMI of 30 and above is considered obese.  Maintain normal blood lipids and cholesterol levels by exercising and minimizing your intake of saturated fat. Eat a balanced diet with plenty of fruit and vegetables. Blood tests for lipids and cholesterol should begin at age 20 and be repeated every 5 years. If your lipid or cholesterol levels are high, you are over 50, or you are at high risk for heart disease, you may need your cholesterol levels checked more frequently.Ongoing high lipid and cholesterol levels should be treated with medicines if diet and exercise are not working.  If you smoke, find out from your health care provider how to quit. If you do not use tobacco, do not start.  Lung cancer screening is recommended for adults aged 55-80 years who are at high risk for developing lung cancer because of a history of smoking. A yearly low-dose CT scan of the lungs is recommended for people who have at least a 30-pack-year history of smoking and are a current smoker or have quit within the past 15 years. A pack year of smoking is smoking an average of 1 pack of cigarettes a day for 1 year (for example: 1 pack a day for 30 years or 2 packs a day for 15 years). Yearly screening should continue until the smoker has stopped smoking for at least 15 years. Yearly screening should be stopped for people who develop a health problem that would prevent them from having lung cancer treatment.  If you choose to drink alcohol, do not have more   than 2 drinks per day. One drink is considered to be 12 ounces (355 mL) of beer, 5 ounces (148 mL) of wine, or 1.5 ounces (44 mL) of liquor.  Avoid use of street drugs. Do not share needles with anyone. Ask for help if you need support or instructions about stopping the use of drugs.  High blood pressure causes heart disease and increases the risk of stroke. Your blood pressure should be checked at least every 1-2 years. Ongoing high blood pressure should be  treated with medicines, if weight loss and exercise are not effective.  If you are 34-90 years old, ask your health care provider if you should take aspirin to prevent heart disease.  Diabetes screening is done by taking a blood sample to check your blood glucose level after you have not eaten for a certain period of time (fasting). If you are not overweight and you do not have risk factors for diabetes, you should be screened once every 3 years starting at age 35. If you are overweight or obese and you are 70-84 years of age, you should be screened for diabetes every year as part of your cardiovascular risk assessment.  Colorectal cancer can be detected and often prevented. Most routine colorectal cancer screening begins at the age of 18 and continues through age 69. However, your health care provider may recommend screening at an earlier age if you have risk factors for colon cancer. On a yearly basis, your health care provider may provide home test kits to check for hidden blood in the stool. Use of a small camera at the end of a tube to directly examine the colon (sigmoidoscopy or colonoscopy) can detect the earliest forms of colorectal cancer. Talk to your health care provider about this at age 71, when routine screening begins. Direct exam of the colon should be repeated every 5-10 years through age 18, unless early forms of precancerous polyps or small growths are found.  People who are at an increased risk for hepatitis B should be screened for this virus. You are considered at high risk for hepatitis B if:  You were born in a country where hepatitis B occurs often. Talk with your health care provider about which countries are considered high risk.  Your parents were born in a high-risk country and you have not received a shot to protect against hepatitis B (hepatitis B vaccine).  You have HIV or AIDS.  You use needles to inject street drugs.  You live with, or have sex with, someone who  has hepatitis B.  You are a man who has sex with other men (MSM).  You get hemodialysis treatment.  You take certain medicines for conditions such as cancer, organ transplantation, and autoimmune conditions.  Hepatitis C blood testing is recommended for all people born from [birth year] through 1965 and any individual with known risks for hepatitis C.  Practice safe sex. Use condoms and avoid high-risk sexual practices to reduce the spread of sexually transmitted infections (STIs). STIs include gonorrhea, chlamydia, syphilis, trichomonas, herpes, HPV, and human immunodeficiency virus (HIV). Herpes, HIV, and HPV are viral illnesses that have no cure. They can result in disability, cancer, and death.  If you are a man who has sex with other men, you should be screened at least once per year for:  HIV.  Urethral, rectal, and pharyngeal infection of gonorrhea, chlamydia, or both.  If you are at risk of being infected with HIV, it is recommended that you take a  prescription medicine daily to prevent HIV infection. This is called preexposure prophylaxis (PrEP). You are considered at risk if:  You are a man who has sex with other men (MSM) and have other risk factors.  You are a heterosexual man, are sexually active, and are at increased risk for HIV infection.  You take drugs by injection.  You are sexually active with a partner who has HIV.  Talk with your health care provider about whether you are at high risk of being infected with HIV. If you choose to begin PrEP, you should first be tested for HIV. You should then be tested every 3 months for as long as you are taking PrEP.  A one-time screening for abdominal aortic aneurysm (AAA) and surgical repair of large AAAs by ultrasound are recommended for men ages 44 to 66 years who are current or former smokers.  Healthy men should no longer receive prostate-specific antigen (PSA) blood tests as part of routine cancer screening. Talk with your health  care provider about prostate cancer screening.  Testicular cancer screening is not recommended for adult males who have no symptoms. Screening includes self-exam, a health care provider exam, and other screening tests. Consult with your health care provider about any symptoms you have or any concerns you have about testicular cancer.  Use sunscreen. Apply sunscreen liberally and repeatedly throughout the day. You should seek shade when your shadow is shorter than you. Protect yourself by wearing long sleeves, pants, a wide-brimmed hat, and sunglasses year round, whenever you are outdoors.  Once a month, do a whole-body skin exam, using a mirror to look at the skin on your back. Tell your health care provider about new moles, moles that have irregular borders, moles that are larger than a pencil eraser, or moles that have changed in shape or color.  Stay current with required vaccines (immunizations).  Influenza vaccine. All adults should be immunized every year.  Tetanus, diphtheria, and acellular pertussis (Td, Tdap) vaccine. An adult who has not previously received Tdap or who does not know his vaccine status should receive 1 dose of Tdap. This initial dose should be followed by tetanus and diphtheria toxoids (Td) booster doses every 10 years. Adults with an unknown or incomplete history of completing a 3-dose immunization series with Td-containing vaccines should begin or complete a primary immunization series including a Tdap dose. Adults should receive a Td booster every 10 years.  Varicella vaccine. An adult without evidence of immunity to varicella should receive 2 doses or a second dose if he has previously received 1 dose.  Human papillomavirus (HPV) vaccine. Males aged 11-21 years who have not received the vaccine previously should receive the 3-dose series. Males aged 22-26 years may be immunized. Immunization is recommended through the age of 23 years for any male who has sex with males  and did not get any or all doses earlier. Immunization is recommended for any person with an immunocompromised condition through the age of 72 years if he did not get any or all doses earlier. During the 3-dose series, the second dose should be obtained 4-8 weeks after the first dose. The third dose should be obtained 24 weeks after the first dose and 16 weeks after the second dose.  Zoster vaccine. One dose is recommended for adults aged 23 years or older unless certain conditions are present.  Measles, mumps, and rubella (MMR) vaccine. Adults born before [birth year] generally are considered immune to measles and mumps. Adults born in [birth year]  or later should have 1 or more doses of MMR vaccine unless there is a contraindication to the vaccine or there is laboratory evidence of immunity to each of the three diseases. A routine second dose of MMR vaccine should be obtained at least 28 days after the first dose for students attending postsecondary schools, health care workers, or international travelers. People who received inactivated measles vaccine or an unknown type of measles vaccine during 1963-1967 should receive 2 doses of MMR vaccine. People who received inactivated mumps vaccine or an unknown type of mumps vaccine before 1979 and are at high risk for mumps infection should consider immunization with 2 doses of MMR vaccine. Unvaccinated health care workers born before [birth year] who lack laboratory evidence of measles, mumps, or rubella immunity or laboratory confirmation of disease should consider measles and mumps immunization with 2 doses of MMR vaccine or rubella immunization with 1 dose of MMR vaccine.  Pneumococcal 13-valent conjugate (PCV13) vaccine. When indicated, a person who is uncertain of his immunization history and has no record of immunization should receive the PCV13 vaccine. All adults 9 years of age and older should receive this vaccine. An adult aged 69 years or older who has certain medical  conditions and has not been previously immunized should receive 1 dose of PCV13 vaccine. This PCV13 should be followed with a dose of pneumococcal polysaccharide (PPSV23) vaccine. Adults who are at high risk for pneumococcal disease should obtain the PPSV23 vaccine at least 8 weeks after the dose of PCV13 vaccine. Adults older than 31 years of age who have normal immune system function should obtain the PPSV23 vaccine dose at least 1 year after the dose of PCV13 vaccine.  Pneumococcal polysaccharide (PPSV23) vaccine. When PCV13 is also indicated, PCV13 should be obtained first. All adults aged 79 years and older should be immunized. An adult younger than age 43 years who has certain medical conditions should be immunized. Any person who resides in a nursing home or long-term care facility should be immunized. An adult smoker should be immunized. People with an immunocompromised condition and certain other conditions should receive both PCV13 and PPSV23 vaccines. People with human immunodeficiency virus (HIV) infection should be immunized as soon as possible after diagnosis. Immunization during chemotherapy or radiation therapy should be avoided. Routine use of PPSV23 vaccine is not recommended for American Indians, [place] Natives, or people younger than 65 years unless there are medical conditions that require PPSV23 vaccine. When indicated, people who have unknown immunization and have no record of immunization should receive PPSV23 vaccine. One-time revaccination 5 years after the first dose of PPSV23 is recommended for people aged 19-64 years who have chronic kidney failure, nephrotic syndrome, asplenia, or immunocompromised conditions. People who received 1-2 doses of PPSV23 before age 70 years should receive another dose of PPSV23 vaccine at age 79 years or later if at least 5 years have passed since the previous dose. Doses of PPSV23 are not needed for people immunized with PPSV23 at or after age 55  years.  Meningococcal vaccine. Adults with asplenia or persistent complement component deficiencies should receive 2 doses of quadrivalent meningococcal conjugate (MenACWY-D) vaccine. The doses should be obtained at least 2 months apart. Microbiologists working with certain meningococcal bacteria, [employer] recruits, people at risk during an outbreak, and people who travel to or live in countries with a high rate of meningitis should be immunized. A first-year college student up through age 64 years who is living in a residence hall should receive a  dose if he did not receive a dose on or after his 16th birthday. Adults who have certain high-risk conditions should receive one or more doses of vaccine.  Hepatitis A vaccine. Adults who wish to be protected from this disease, have chronic liver disease, work with hepatitis A-infected animals, work in hepatitis A research labs, or travel to or work in countries with a high rate of hepatitis A should be immunized. Adults who were previously unvaccinated and who anticipate close contact with an international adoptee during the first 60 days after arrival in the [place] from a country with a high rate of hepatitis A should be immunized.  Hepatitis B vaccine. Adults should be immunized if they wish to be protected from this disease, are under age 34 years and have diabetes, have chronic liver disease, have had more than one sex partner in the past 6 months, may be exposed to blood or other infectious body fluids, are household contacts or sex partners of hepatitis B positive people, are clients or workers in certain care facilities, or travel to or work in countries with a high rate of hepatitis B.  Haemophilus influenzae type b (Hib) vaccine. A previously unvaccinated person with asplenia or sickle cell disease or having a scheduled splenectomy should receive 1 dose of Hib vaccine. Regardless of previous immunization, a recipient of a hematopoietic stem cell  transplant should receive a 3-dose series 6-12 months after his successful transplant. Hib vaccine is not recommended for adults with HIV infection. Preventive Service / Frequency Ages 77 to 55  Blood pressure check.** / Every 3-5 years.  Lipid and cholesterol check.** / Every 5 years beginning at age 66.  Hepatitis C blood test.** / For any individual with known risks for hepatitis C.  Skin self-exam. / Monthly.  Influenza vaccine. / Every year.  Tetanus, diphtheria, and acellular pertussis (Tdap, Td) vaccine.** / Consult your health care provider. 1 dose of Td every 10 years.  Varicella vaccine.** / Consult your health care provider.  HPV vaccine. / 3 doses over 6 months, if 45 or younger.  Measles, mumps, rubella (MMR) vaccine.** / You need at least 1 dose of MMR if you were born in 1957 or later. You may also need a second dose.  Pneumococcal 13-valent conjugate (PCV13) vaccine.** / Consult your health care provider.  Pneumococcal polysaccharide (PPSV23) vaccine.** / 1 to 2 doses if you smoke cigarettes or if you have certain conditions.  Meningococcal vaccine.** / 1 dose if you are age 81 to 79 years and a Market researcher living in a residence hall, or have one of several medical conditions. You may also need additional booster doses.  Hepatitis A vaccine.** / Consult your health care provider.  Hepatitis B vaccine.** / Consult your health care provider.  Haemophilus influenzae type b (Hib) vaccine.** / Consult your health care provider. Ages 6 to 58  Blood pressure check.** / Every year.  Lipid and cholesterol check.** / Every 5 years beginning at age 89.  Lung cancer screening. / Every year if you are aged 84-80 years and have a 30-pack-year history of smoking and currently smoke or have quit within the past 15 years. Yearly screening is stopped once you have quit smoking for at least 15 years or develop a health problem that would prevent you from having  lung cancer treatment.  Fecal occult blood test (FOBT) of stool. / Every year beginning at age 90 and continuing until age 73. You may not have to do  this test if you get a colonoscopy every 10 years.  Flexible sigmoidoscopy** or colonoscopy.** / Every 5 years for a flexible sigmoidoscopy or every 10 years for a colonoscopy beginning at age 50 and continuing until age 75.  Hepatitis C blood test.** / For all people born from 1945 through 1965 and any individual with known risks for hepatitis C.  Skin self-exam. / Monthly.  Influenza vaccine. / Every year.  Tetanus, diphtheria, and acellular pertussis (Tdap/Td) vaccine.** / Consult your health care provider. 1 dose of Td every 10 years.  Varicella vaccine.** / Consult your health care provider.  Zoster vaccine.** / 1 dose for adults aged 60 years or older.  Measles, mumps, rubella (MMR) vaccine.** / You need at least 1 dose of MMR if you were born in 1957 or later. You may also need a second dose.  Pneumococcal 13-valent conjugate (PCV13) vaccine.** / Consult your health care provider.  Pneumococcal polysaccharide (PPSV23) vaccine.** / 1 to 2 doses if you smoke cigarettes or if you have certain conditions.  Meningococcal vaccine.** / Consult your health care provider.  Hepatitis A vaccine.** / Consult your health care provider.  Hepatitis B vaccine.** / Consult your health care provider.  Haemophilus influenzae type b (Hib) vaccine.** / Consult your health care provider. Ages 65 and over  Blood pressure check.** / Every year.  Lipid and cholesterol check.**/ Every 5 years beginning at age 20.  Lung cancer screening. / Every year if you are aged 55-80 years and have a 30-pack-year history of smoking and currently smoke or have quit within the past 15 years. Yearly screening is stopped once you have quit smoking for at least 15 years or develop a health problem that would prevent you from having lung cancer treatment.  Fecal  occult blood test (FOBT) of stool. / Every year beginning at age 50 and continuing until age 75. You may not have to do this test if you get a colonoscopy every 10 years.  Flexible sigmoidoscopy** or colonoscopy.** / Every 5 years for a flexible sigmoidoscopy or every 10 years for a colonoscopy beginning at age 50 and continuing until age 75.  Hepatitis C blood test.** / For all people born from 1945 through 1965 and any individual with known risks for hepatitis C.  Abdominal aortic aneurysm (AAA) screening.** / A one-time screening for ages 65 to 75 years who are current or former smokers.  Skin self-exam. / Monthly.  Influenza vaccine. / Every year.  Tetanus, diphtheria, and acellular pertussis (Tdap/Td) vaccine.** / 1 dose of Td every 10 years.  Varicella vaccine.** / Consult your health care provider.  Zoster vaccine.** / 1 dose for adults aged 60 years or older.  Pneumococcal 13-valent conjugate (PCV13) vaccine.** / 1 dose for all adults aged 65 years and older.  Pneumococcal polysaccharide (PPSV23) vaccine.** / 1 dose for all adults aged 65 years and older.  Meningococcal vaccine.** / Consult your health care provider.  Hepatitis A vaccine.** / Consult your health care provider.  Hepatitis B vaccine.** / Consult your health care provider.  Haemophilus influenzae type b (Hib) vaccine.** / Consult your health care provider. **Family history and personal history of risk and conditions may change your health care provider's recommendations.   This information is not intended to replace advice given to you by your health care provider. Make sure you discuss any questions you have with your health care provider.   Document Released: 05/15/2001 Document Revised: 04/09/2014 Document Reviewed: 08/14/2010 Elsevier Interactive Patient Education 2016   [employer] + Cannabis Use Disorder Cannabis use disorder is a mental disorder. It is not one-time or occasional use of cannabis, more  commonly known as marijuana. Cannabis use disorder is the continued, nonmedical use of cannabis that interferes with normal life activities or causes health problems. People with cannabis use disorder get a feeling of extreme pleasure and relaxation from cannabis use. This "high" is very rewarding and causes people to use over and over.  The mind-altering ingredient in cannabis is know as THC. THC can also interfere with motor coordination, memory, judgment, and accurate sense of space and time. These effects can last for a few days after using cannabis. Regular heavy cannabis use can cause long-lasting problems with thinking and learning. In young people, these problems may be permanent. Cannabis sometimes causes severe anxiety, paranoia, or visual hallucinations. Man-made (synthetic) cannabis-like drugs, such as "spice" and "K2," cause the same effects as THC but are much stronger. Cannabis-like drugs can cause dangerously high blood pressure and heart rate.  Cannabis use disorder usually starts in the teenage years. It can trigger the development of schizophrenia. It is somewhat more common in men than women. People who have family members with the disorder or existing mental health issues such as depression and posttraumatic stress disorderare more likely to develop cannabis use disorder. People with cannabis use disorder are at higher risk for use of other drugs of abuse.  SIGNS AND SYMPTOMS Signs and symptoms of cannabis use disorder include:   Use of cannabis in larger amounts or over a longer period than intended.   Unsuccessful attempts to cut down or control cannabis use.   A lot of time spent obtaining, using, or recovering from the effects of cannabis.   A strong desire or urge to use cannabis (cravings).   Continued use of cannabis in spite of problems at work, school, or home because of use.   Continued use of cannabis in spite of relationship problems because of use.  Giving up  or cutting down on important life activities because of cannabis use.  Use of cannabis over and over even in situations when it is physically hazardous, such as when driving a car.   Continued use of cannabis in spite of a physical problem that is likely related to use. Physical problems can include:  Chronic cough.  Bronchitis.  Emphysema.  Throat and lung cancer.  Continued use of cannabis in spite of a mental problem that is likely related to use. Mental problems can include:  Psychosis.  Anxiety.  Difficulty sleeping.  Need to use more and more cannabis to get the same effect, or lessened effect over time with use of the same amount (tolerance).  Having withdrawal symptoms when cannabis use is stopped, or using cannabis to reduce or avoid withdrawal symptoms. Withdrawal symptoms include:  Irritability or anger.  Anxiety or restlessness.  Difficulty sleeping.  Loss of appetite or weight.  Aches and pains.  Shakiness.  Sweating.  Chills. DIAGNOSIS Cannabis use disorder is diagnosed by your health care provider. You may be asked questions about your cannabis use and how it affects your life. A physical exam may be done. A drug screen may be done. You may be referred to a mental health professional. The diagnosis of cannabis use disorder requires at least two symptoms within 12 months. The type of cannabis use disorder you have depends on the number of symptoms you have. The type may be:  Mild. Two or three signs and symptoms.  Moderate. Four or five signs and symptoms.   Severe. Six or more signs and symptoms.  TREATMENT Treatment is usually provided by mental health professionals with training in substance use disorders. The following options are available:  Counseling or talk therapy. Talk therapy addresses the reasons you use cannabis. It also addresses ways to keep you from using again. The goals of talk therapy include:  Identifying and avoiding  triggers for use.  Learning how to handle cravings.  Replacing use with healthy activities.  Support groups. Support groups provide emotional support, advice, and guidance.  Medicine. Medicine is used to treat mental health issues that trigger cannabis use or that result from it. HOME CARE INSTRUCTIONS  Take medicines only as directed by your health care provider.  Check with your health care provider before starting any new medicines.  Keep all follow-up visits as directed by your health care provider. SEEK MEDICAL CARE IF:  You are not able to take your medicines as directed.  Your symptoms get worse. SEEK IMMEDIATE MEDICAL CARE IF: You have serious thoughts about hurting yourself or others. [place] [facility]: motorcyclefax.com  Substance Abuse and Mental Health Services Administration: ktimeonline.com   This information is not intended to replace advice given to you by your health care provider. Make sure you discuss any questions you have with your health care provider.   Document Released: 03/16/2000 Document Revised: 04/09/2014 Document Reviewed: 04/01/2013 Elsevier Interactive Patient Education [employer].

## 2015-02-18 NOTE — Progress Notes (Signed)
Patient ID: William Zimmerman, male    DOB: [birth year]  Age: 29 y.o. MRN: [id number]    Subjective:  Subjective HPI William Zimmerman presents for cpe.  Pt c/o using proair daily or bid.    Review of Systems  Constitutional: Negative.  Negative for diaphoresis, appetite change, fatigue and unexpected weight change.  HENT: Negative for congestion, ear pain, hearing loss, nosebleeds, postnasal drip, rhinorrhea, sinus pressure, sneezing and tinnitus.   Eyes: Negative for photophobia, pain, discharge, redness, itching and visual disturbance.  Respiratory: Negative.  Negative for cough, chest tightness, shortness of breath and wheezing.   Cardiovascular: Negative.  Negative for chest pain, palpitations and leg swelling.  Gastrointestinal: Negative for abdominal pain, constipation, blood in stool, abdominal distention and anal bleeding.  Endocrine: Negative.  Negative for cold intolerance, heat intolerance, polydipsia, polyphagia and polyuria.  Genitourinary: Negative.  Negative for dysuria, frequency and difficulty urinating.  Musculoskeletal: Positive for back pain. Negative for myalgias, joint swelling and gait problem.  Skin: Negative.   Allergic/Immunologic: Negative.   Neurological: Negative for dizziness, weakness, light-headedness, numbness and headaches.  Psychiatric/Behavioral: Negative for suicidal ideas, confusion, sleep disturbance, dysphoric mood, decreased concentration and agitation. The patient is not nervous/anxious.     History Past Medical History  Diagnosis Date  . Asthma   . Seasonal allergies     He has no past surgical history on file.   His family history includes Arthritis in his maternal grandfather and mother; Diabetes in his maternal grandfather and mother; Hypertension in his father.He reports that he has been smoking.  He has never used smokeless tobacco. He reports that he drinks about 2.4 oz of alcohol per week. He reports that he uses illicit drugs  (Marijuana).  Current Outpatient Prescriptions on File Prior to Visit  Medication Sig Dispense Refill  . [DISCONTINUED] ALBUTEROL IN Inhale into the lungs.     No current facility-administered medications on file prior to visit.     Objective:  Objective Physical Exam  Constitutional: He is oriented to person, place, and time. He appears well-developed and well-nourished. No distress.  HENT:  Head: Normocephalic and atraumatic.  Right Ear: External ear normal.  Left Ear: External ear normal.  Nose: Nose normal.  Mouth/Throat: Oropharynx is clear and moist. No oropharyngeal exudate.  Eyes: Conjunctivae and EOM are normal. Pupils are equal, round, and reactive to light. Right eye exhibits no discharge. Left eye exhibits no discharge.  Neck: Normal range of motion. Neck supple. No JVD present. No thyromegaly present.  Cardiovascular: Normal rate, regular rhythm and intact distal pulses.  Exam reveals no gallop and no friction rub.   No murmur heard. Pulmonary/Chest: Effort normal and breath sounds normal. No respiratory distress. He has no wheezes. He has no rales. He exhibits no tenderness.  Abdominal: Soft. Bowel sounds are normal. He exhibits no distension and no mass. There is no tenderness. There is no rebound and no guarding.  Genitourinary: Rectum normal, prostate normal and penis normal. Guaiac negative stool.  Musculoskeletal: Normal range of motion. He exhibits no edema or tenderness.  Lymphadenopathy:    He has no cervical adenopathy.  Neurological: He is alert and oriented to person, place, and time. He displays normal reflexes. He exhibits normal muscle tone.  Skin: Skin is warm and dry. No rash noted. He is not diaphoretic. No erythema. No pallor.  Psychiatric: He has a normal mood and affect. His behavior is normal. Judgment and thought content normal.  Nursing note and vitals reviewed.  BP 138/90  mmHg  Pulse 66  Temp(Src) 98.1 F (36.7 C) (Oral)  Ht 6' (1.829 m)   Wt 220 lb (99.791 kg)  BMI 29.83 kg/m2  SpO2 98% Wt Readings from Last 3 Encounters:  02/18/15 220 lb (99.791 kg)  02/05/14 200 lb 3.2 oz (90.81 kg)     No results found for: WBC, HGB, HCT, PLT, GLUCOSE, CHOL, TRIG, HDL, LDLDIRECT, LDLCALC, ALT, AST, NA, K, CL, CREATININE, BUN, CO2, TSH, PSA, INR, GLUF, HGBA1C, MICROALBUR  No results found.   Assessment & Plan:  Plan I am having Mr. Feinstein maintain his albuterol and beclomethasone.  Meds ordered this encounter  Medications  . albuterol (PROAIR HFA) 108 (90 BASE) MCG/ACT inhaler    Sig: INHALE 2 PUFFS BY MOUTH EVERY 4 HOURS AS NEEDED FOR WHEEZING    Dispense:  8.5 g    Refill:  11  . beclomethasone (QVAR) 80 MCG/ACT inhaler    Sig: Inhale 2 puffs into the lungs 2 (two) times daily.    Dispense:  1 Inhaler    Refill:  12    Problem List Items Addressed This Visit    Preventative health care - Primary    See AVS ghm utd Check labs       Relevant Orders   POCT urinalysis dipstick   TSH   Lipid panel   CBC with Differential/Platelet   Comp Met (CMET)    Other Visit Diagnoses    Asthma, mild intermittent, uncomplicated        Relevant Medications    albuterol (PROAIR HFA) 108 (90 BASE) MCG/ACT inhaler    beclomethasone (QVAR) 80 MCG/ACT inhaler    Need for Tdap vaccination        Relevant Orders    Tdap vaccine greater than or equal to 7yo IM (Completed)    Asthma, mild persistent, uncomplicated        Relevant Medications    albuterol (PROAIR HFA) 108 (90 BASE) MCG/ACT inhaler    beclomethasone (QVAR) 80 MCG/ACT inhaler       Follow-up: Return if symptoms worsen or fail to improve, for annual exam, fasting.  Garnet Koyanagi, DO

## 2015-02-19 ENCOUNTER — Encounter: Payer: Self-pay | Admitting: Family Medicine

## 2015-02-19 DIAGNOSIS — Z0000 Encounter for general adult medical examination without abnormal findings: Secondary | ICD-10-CM | POA: Insufficient documentation

## 2015-02-19 MED ORDER — BECLOMETHASONE DIPROPIONATE 80 MCG/ACT IN AERS: 2.0000 | INHALATION_SPRAY | Freq: Two times a day (BID) | RESPIRATORY_TRACT | Status: DC

## 2015-02-19 NOTE — Assessment & Plan Note (Signed)
See AVS ghm utd Check labs 

## 2015-03-16 ENCOUNTER — Telehealth: Payer: Self-pay | Admitting: Family Medicine

## 2015-03-16 NOTE — Telephone Encounter (Signed)
Message left to call the office.    KP 

## 2015-03-16 NOTE — Telephone Encounter (Signed)
Pt said he lost his inhaler. He cannot get filled til 03/20/15 per pharmacy.

## 2015-03-17 NOTE — Telephone Encounter (Signed)
Message left to call the office.    KP 

## 2015-03-18 NOTE — Telephone Encounter (Signed)
Unable to reach the patient. We have no authority over [employer] insurance company.      KP

## 2015-12-25 ENCOUNTER — Other Ambulatory Visit: Payer: Self-pay | Admitting: Family Medicine

## 2015-12-25 DIAGNOSIS — J4520 Mild intermittent asthma, uncomplicated: Secondary | ICD-10-CM

## 2016-02-17 ENCOUNTER — Other Ambulatory Visit: Payer: Self-pay | Admitting: Family Medicine

## 2016-02-17 DIAGNOSIS — J4520 Mild intermittent asthma, uncomplicated: Secondary | ICD-10-CM

## 2016-02-17 NOTE — Telephone Encounter (Signed)
1 month supply of Proair sent to pharmacy. Pt last OV 02/18/15.  Pt needs physical with Dr Laury AxonLowne as soon as possible. Please call pt to schedule appt. Thanks!

## 2016-02-20 NOTE — Telephone Encounter (Signed)
Patient called and requested an appointment next week for a med FU. No available appts. Plse adv

## 2016-02-20 NOTE — Telephone Encounter (Signed)
Patients VM has not been set up. Could not leave message °

## 2016-02-21 NOTE — Telephone Encounter (Signed)
Called to inform pt that we were able to get an appointment for a physical, pt has not been seen since11/22/16. Called all the numbers listed in pt's chart, work and home number are disconnected and was not able to leave message on pt's mobile vm. Will try again later. LB

## 2016-02-21 NOTE — Telephone Encounter (Signed)
Pt is actually for a CPE soon. If he has an acute situation that needs to be addressed prior to his CPE we can look at scheduling him with another Provider who may have an opening sooner.Ifare no acute concerns, then he can wait until next available appointment for his CPE.

## 2016-03-21 ENCOUNTER — Other Ambulatory Visit: Payer: Self-pay | Admitting: Family Medicine

## 2016-03-21 DIAGNOSIS — J4520 Mild intermittent asthma, uncomplicated: Secondary | ICD-10-CM

## 2016-04-23 ENCOUNTER — Encounter: Payer: Self-pay | Admitting: Family Medicine

## 2016-04-23 ENCOUNTER — Ambulatory Visit (INDEPENDENT_AMBULATORY_CARE_PROVIDER_SITE_OTHER): Payer: BLUE CROSS/BLUE SHIELD | Admitting: Family Medicine

## 2016-04-23 VITALS — BP 130/88 | HR 102 | Temp 98.3°F | Resp 16 | Ht 71.5 in | Wt 219.4 lb

## 2016-04-23 DIAGNOSIS — J4520 Mild intermittent asthma, uncomplicated: Secondary | ICD-10-CM | POA: Diagnosis not present

## 2016-04-23 DIAGNOSIS — Z23 Encounter for immunization: Secondary | ICD-10-CM

## 2016-04-23 DIAGNOSIS — Z0000 Encounter for general adult medical examination without abnormal findings: Secondary | ICD-10-CM

## 2016-04-23 MED ORDER — BECLOMETHASONE DIPROPIONATE 80 MCG/ACT IN AERS: 2.0000 | INHALATION_SPRAY | Freq: Two times a day (BID) | RESPIRATORY_TRACT | 12 refills | Status: DC

## 2016-04-23 MED ORDER — ALBUTEROL SULFATE HFA 108 (90 BASE) MCG/ACT IN AERS: INHALATION_SPRAY | RESPIRATORY_TRACT | 3 refills | Status: DC

## 2016-04-23 NOTE — Assessment & Plan Note (Signed)
ghm utd Check labs See AVS 

## 2016-04-23 NOTE — Patient Instructions (Signed)
Preventive Care 18-39 Years, Male Preventive care refers to lifestyle choices and visits with your health care provider that can promote health and wellness. What does preventive care include?  A yearly physical exam. This is also called an annual well check.  Dental exams once or twice a year.  Routine eye exams. Ask your health care provider how often you should have your eyes checked.  Personal lifestyle choices, including:  Daily care of your teeth and gums.  Regular physical activity.  Eating a healthy diet.  Avoiding tobacco and drug use.  Limiting alcohol use.  Practicing safe sex. What happens during an annual well check? The services and screenings done by your health care provider during your annual well check will depend on your age, overall health, lifestyle risk factors, and family history of disease. Counseling  Your health care provider may ask you questions about your:  Alcohol use.  Tobacco use.  Drug use.  Emotional well-being.  Home and relationship well-being.  Sexual activity.  Eating habits.  Work and work Statistician. Screening  You may have the following tests or measurements:  Height, weight, and BMI.  Blood pressure.  Lipid and cholesterol levels. These may be checked every 5 years starting at age 64.  Diabetes screening. This is done by checking your blood sugar (glucose) after you have not eaten for a while (fasting).  Skin check.  Hepatitis C blood test.  Hepatitis B blood test.  Sexually transmitted disease (STD) testing. Discuss your test results, treatment options, and if necessary, the need for more tests with your health care provider. Vaccines  Your health care provider may recommend certain vaccines, such as:  Influenza vaccine. This is recommended every year.  Tetanus, diphtheria, and acellular pertussis (Tdap, Td) vaccine. You may need a Td booster every 10 years.  Varicella vaccine. You may need this if you  have not been vaccinated.  HPV vaccine. If you are 63 or younger, you may need three doses over 6 months.  Measles, mumps, and rubella (MMR) vaccine. You may need at least one dose of MMR.You may also need a second dose.  Pneumococcal 13-valent conjugate (PCV13) vaccine. You may need this if you have certain conditions and have not been vaccinated.  Pneumococcal polysaccharide (PPSV23) vaccine. You may need one or two doses if you smoke cigarettes or if you have certain conditions.  Meningococcal vaccine. One dose is recommended if you are age 68-21 years and a first-year college student living in a residence hall, or if you have one of several medical conditions. You may also need additional booster doses.  Hepatitis A vaccine. You may need this if you have certain conditions or if you travel or work in places where you may be exposed to hepatitis A.  Hepatitis B vaccine. You may need this if you have certain conditions or if you travel or work in places where you may be exposed to hepatitis B.  Haemophilus influenzae type b (Hib) vaccine. You may need this if you have certain risk factors. Talk to your health care provider about which screenings and vaccines you need and how often you need them. This information is not intended to replace advice given to you by your health care provider. Make sure you discuss any questions you have with your health care provider. Document Released: 05/15/2001 Document Revised: 12/07/2015 Document Reviewed: 01/18/2015 Elsevier Interactive Patient Education  2017 [employer].    Influenza (Flu) Vaccine (Inactivated or Recombinant): What You Need to Know 1.  Why get vaccinated? Influenza ("flu") is a contagious disease that spreads around the [place] every year, usually between October and May. Flu is caused by influenza viruses, and is spread mainly by coughing, sneezing, and close contact. Anyone can get flu. Flu strikes suddenly and can last  several days. Symptoms vary by age, but can include:  fever/chills  sore throat  muscle aches  fatigue  cough  headache  runny or stuffy nose Flu can also lead to pneumonia and blood infections, and cause diarrhea and seizures in children. If you have a medical condition, such as heart or lung disease, flu can make it worse. Flu is more dangerous for some people. Infants and young children, people 23 years of age and older, pregnant women, and people with certain health conditions or a weakened immune system are at greatest risk. Each year thousands of people in the [place] die from flu, and many more are hospitalized. Flu vaccine can:  keep you from getting flu,  make flu less severe if you do get it, and  keep you from spreading flu to your family and other people. 2. Inactivated and recombinant flu vaccines A dose of flu vaccine is recommended every flu season. Children 6 months through 50 years of age may need two doses during the same flu season. Everyone else needs only one dose each flu season. Some inactivated flu vaccines contain a very small amount of a mercury-based preservative called thimerosal. Studies have not shown thimerosal in vaccines to be harmful, but flu vaccines that do not contain thimerosal are available. There is no live flu virus in flu shots. They cannot cause the flu. There are many flu viruses, and they are always changing. Each year a new flu vaccine is made to protect against three or four viruses that are likely to cause disease in the upcoming flu season. But even when the vaccine doesn't exactly match these viruses, it may still provide some protection. Flu vaccine cannot prevent:  flu that is caused by a virus not covered by the vaccine, or  illnesses that look like flu but are not. It takes about 2 weeks for protection to develop after vaccination, and protection lasts through the flu season. 3. Some people should not get this  vaccine Tell the person who is giving you the vaccine:  If you have any severe, life-threatening allergies. If you ever had a life-threatening allergic reaction after a dose of flu vaccine, or have a severe allergy to any part of this vaccine, you may be advised not to get vaccinated. Most, but not all, types of flu vaccine contain a small amount of egg protein.  If you ever had Guillain-Barr Syndrome (also called GBS). Some people with a history of GBS should not get this vaccine. This should be discussed with your doctor.  If you are not feeling well. It is usually okay to get flu vaccine when you have a mild illness, but you might be asked to come back when you feel better. 4. Risks of a vaccine reaction With any medicine, including vaccines, there is a chance of reactions. These are usually mild and go away on their own, but serious reactions are also possible. Most people who get a flu shot do not have any problems with it. Minor problems following a flu shot include:  soreness, redness, or swelling where the shot was given  hoarseness  sore, red or itchy eyes  cough  fever  aches  headache  itching  fatigue If these problems occur, they usually begin soon after the shot and last 1 or 2 days. More serious problems following a flu shot can include the following:  There may be a small increased risk of Guillain-Barre Syndrome (GBS) after inactivated flu vaccine. This risk has been estimated at 1 or 2 additional cases per million people vaccinated. This is much lower than the risk of severe complications from flu, which can be prevented by flu vaccine.  Young children who get the flu shot along with pneumococcal vaccine (PCV13) and/or DTaP vaccine at the same time might be slightly more likely to have a seizure caused by fever. Ask your doctor for more information. Tell your doctor if a child who is getting flu vaccine has ever had a seizure. Problems that could happen after  any injected vaccine:  People sometimes faint after a medical procedure, including vaccination. Sitting or lying down for about 15 minutes can help prevent fainting, and injuries caused by a fall. Tell your doctor if you feel dizzy, or have vision changes or ringing in the ears.  Some people get severe pain in the shoulder and have difficulty moving the arm where a shot was given. This happens very rarely.  Any medication can cause a severe allergic reaction. Such reactions from a vaccine are very rare, estimated at about 1 in a million doses, and would happen within a few minutes to a few hours after the vaccination. As with any medicine, there is a very remote chance of a vaccine causing a serious injury or death. The safety of vaccines is always being monitored. For more information, visit: http://www.aguilar.org/ 5. What if there is a serious reaction? What should I look for? Look for anything that concerns you, such as signs of a severe allergic reaction, very high fever, or unusual behavior. Signs of a severe allergic reaction can include hives, swelling of the face and throat, difficulty breathing, a fast heartbeat, dizziness, and weakness. These would start a few minutes to a few hours after the vaccination. What should I do?  If you think it is a severe allergic reaction or other emergency that can't wait, call 9-1-1 and get the person to the nearest hospital. Otherwise, call your doctor.  Reactions should be reported to the Vaccine Adverse Event Reporting System (VAERS). Your doctor should file this report, or you can do it yourself through the VAERS web site at www.vaers.SamedayNews.es, or by calling 413-061-6044.  VAERS does not give medical advice. 6. The National Vaccine Injury Compensation Program The [employer] Vaccine Injury Compensation Program (VICP) is a federal program that was created to compensate people who may have been injured by certain vaccines. Persons who believe they  may have been injured by a vaccine can learn about the program and about filing a claim by calling 605-554-3056 or visiting the [employer] website at GoldCloset.com.ee. There is a time limit to file a claim for compensation. 7. How can I learn more?  Ask your healthcare provider. He or she can give you the vaccine package insert or suggest other sources of information.  Call your local or state health department.  Contact the Centers for Disease Control and Prevention (CDC):  Call 920 668 9286 (1-800-CDC-INFO) or  Visit CDC's website at https://gibson.com/ Vaccine Information Statement, Inactivated Influenza Vaccine (11/06/2013) This information is not intended to replace advice given to you by your health care provider. Make sure you discuss any questions you have with your health care provider. Document Released: 01/11/2006 Document Revised: 12/08/2015  Document Reviewed: 12/08/2015 Elsevier Interactive Patient Education  2017 [employer].

## 2016-04-23 NOTE — Progress Notes (Signed)
Patient ID: William Zimmerman, male    DOB: 01/19/1986  Age: 31 y.o. MRN: [id number]    Subjective:  Subjective  HPI William Spraydrian Onstad presents for cpe.  No complaints.  Pt is using proair too much and not using qvar at all.    Review of Systems  Constitutional: Negative.   HENT: Negative for congestion, ear pain, hearing loss, nosebleeds, postnasal drip, rhinorrhea, sinus pressure, sneezing and tinnitus.   Eyes: Negative for photophobia, discharge, itching and visual disturbance.  Respiratory: Negative.   Cardiovascular: Negative.   Gastrointestinal: Negative for abdominal distention, abdominal pain, anal bleeding, blood in stool and constipation.  Endocrine: Negative.   Genitourinary: Negative.   Musculoskeletal: Negative.   Skin: Negative.   Allergic/Immunologic: Negative.   Neurological: Negative for dizziness, weakness, light-headedness, numbness and headaches.  Psychiatric/Behavioral: Negative for agitation, confusion, decreased concentration, dysphoric mood, sleep disturbance and suicidal ideas. The patient is not nervous/anxious.     History Past Medical History:  Diagnosis Date  . Asthma   . Seasonal allergies     He has no past surgical history on file.   His family history includes Arthritis in his maternal grandfather and mother; Diabetes in his maternal grandfather and mother; Hypertension in his father.He reports that he has been smoking.  He has never used smokeless tobacco. He reports that he drinks about 2.4 oz of alcohol per week . He reports that he uses drugs, including Marijuana.  Current Outpatient Prescriptions on File Prior to Visit  Medication Sig Dispense Refill  . [DISCONTINUED] ALBUTEROL IN Inhale into the lungs.     No current facility-administered medications on file prior to visit.      Objective:  Objective  Physical Exam  Constitutional: He is oriented to person, place, and time. He appears well-developed and well-nourished. No distress.  HENT:    Head: Normocephalic and atraumatic.  Right Ear: External ear normal.  Left Ear: External ear normal.  Nose: Nose normal.  Mouth/Throat: Oropharynx is clear and moist. No oropharyngeal exudate.  Eyes: Conjunctivae and EOM are normal. Pupils are equal, round, and reactive to light. Right eye exhibits no discharge. Left eye exhibits no discharge.  Neck: Normal range of motion. Neck supple. No JVD present. No thyromegaly present.  Cardiovascular: Normal rate, regular rhythm and intact distal pulses.  Exam reveals no gallop and no friction rub.   No murmur heard. Pulmonary/Chest: Effort normal and breath sounds normal. No respiratory distress. He has no wheezes. He has no rales. He exhibits no tenderness.  Abdominal: Soft. Bowel sounds are normal. He exhibits no distension and no mass. There is no tenderness. There is no rebound and no guarding. No hernia. Hernia confirmed negative in the ventral area, confirmed negative in the right inguinal area and confirmed negative in the left inguinal area.  Musculoskeletal: Normal range of motion. He exhibits no edema or tenderness.  Lymphadenopathy:    He has no cervical adenopathy.  Neurological: He is alert and oriented to person, place, and time. He displays normal reflexes. He exhibits normal muscle tone.  Skin: Skin is warm and dry. No rash noted. He is not diaphoretic. No erythema. No pallor.  Psychiatric: He has a normal mood and affect. His behavior is normal. Judgment and thought content normal.  Nursing note and vitals reviewed.  BP 130/88 (BP Location: Left Arm, Cuff Size: Large)   Pulse (!) 102   Temp 98.3 F (36.8 C) (Oral)   Resp 16   Ht 5' 11.5" (1.816 m)  Wt 219 lb 6.4 oz (99.5 kg)   SpO2 96%   BMI 30.17 kg/m  Wt Readings from Last 3 Encounters:  04/23/16 219 lb 6.4 oz (99.5 kg)  02/18/15 220 lb (99.8 kg)  02/05/14 200 lb 3.2 oz (90.8 kg)     No results found for: WBC, HGB, HCT, PLT, GLUCOSE, CHOL, TRIG, HDL, LDLDIRECT,  LDLCALC, ALT, AST, NA, K, CL, CREATININE, BUN, CO2, TSH, PSA, INR, GLUF, HGBA1C, MICROALBUR  No results found.   Assessment & Plan:  Plan  I have changed Mr. Schurman PROAIR HFA to albuterol. I am also having him maintain his beclomethasone.  Meds ordered this encounter  Medications  . albuterol (PROAIR HFA) 108 (90 Base) MCG/ACT inhaler    Sig: INHALE 2 PUFFS INTO THE LUNGS EVERY 4 HOURS AS NEEDED FOR WHEEZING.    Dispense:  8.5 g    Refill:  3  . beclomethasone (QVAR) 80 MCG/ACT inhaler    Sig: Inhale 2 puffs into the lungs 2 (two) times daily.    Dispense:  1 Inhaler    Refill:  12    Problem List Items Addressed This Visit      Unprioritized   Preventative health care - Primary    ghm utd Check labs See AVS      Relevant Orders   CBC   Comprehensive metabolic panel   Lipid panel   TSH    Other Visit Diagnoses    Mild intermittent asthma without complication       Relevant Medications   albuterol (PROAIR HFA) 108 (90 Base) MCG/ACT inhaler   beclomethasone (QVAR) 80 MCG/ACT inhaler   Influenza vaccine needed       Relevant Orders   Flu Vaccine QUAD 36+ mos IM (Fluarix & Fluzone Quad PF (Completed)      Follow-up: No Follow-up on file.  Donato Schultz, DO

## 2016-04-23 NOTE — Assessment & Plan Note (Signed)
Pt instructed on proper use of inhalers Coupon found for qvar  rto if any questions or concerns

## 2016-04-23 NOTE — Progress Notes (Signed)
Pre visit review using our clinic review tool, if applicable. No additional management support is needed unless otherwise documented below in the visit note. 

## 2016-04-24 LAB — CBC
HCT: 45.2 % (ref 39.0–52.0)
Hemoglobin: 15.3 g/dL (ref 13.0–17.0)
MCHC: 33.8 g/dL (ref 30.0–36.0)
MCV: 81.5 fl (ref 78.0–100.0)
PLATELETS: 272 10*3/uL (ref 150.0–400.0)
RBC: 5.54 Mil/uL (ref 4.22–5.81)
RDW: 14 % (ref 11.5–15.5)
WBC: 8.6 10*3/uL (ref 4.0–10.5)

## 2016-04-24 LAB — LIPID PANEL
CHOLESTEROL: 230 mg/dL — AB (ref 0–200)
HDL: 76.2 mg/dL (ref >39.00)
LDL Cholesterol: 123 mg/dL — ABNORMAL HIGH (ref 0–99)
NonHDL: 153.43
Total CHOL/HDL Ratio: 3
Triglycerides: 150 mg/dL — ABNORMAL HIGH (ref 0.0–149.0)
VLDL: 30 mg/dL (ref 0.0–40.0)

## 2016-04-24 LAB — COMPREHENSIVE METABOLIC PANEL
ALBUMIN: 4.8 g/dL (ref 3.5–5.2)
ALT: 47 U/L (ref 0–53)
AST: 30 U/L (ref 0–37)
Alkaline Phosphatase: 57 U/L (ref 39–117)
BILIRUBIN TOTAL: 0.3 mg/dL (ref 0.2–1.2)
BUN: 19 mg/dL (ref 6–23)
CHLORIDE: 106 meq/L (ref 96–112)
CO2: 29 mEq/L (ref 19–32)
CREATININE: 1.14 mg/dL (ref 0.40–1.50)
Calcium: 9.5 mg/dL (ref 8.4–10.5)
GFR: 95.61 mL/min (ref >60.00)
Glucose, Bld: 121 mg/dL — ABNORMAL HIGH (ref 70–99)
Potassium: 4.3 mEq/L (ref 3.5–5.1)
Sodium: 140 mEq/L (ref 135–145)
TOTAL PROTEIN: 7.7 g/dL (ref 6.0–8.3)

## 2016-04-24 LAB — TSH: TSH: 0.6 u[IU]/mL (ref 0.35–4.50)

## 2016-05-24 MED FILL — QVAR 80 MCG ORAL INHALER: 80 | 30 days supply | Qty: 9 | Fill #0

## 2016-08-04 ENCOUNTER — Other Ambulatory Visit: Payer: Self-pay | Admitting: Family Medicine

## 2016-08-04 DIAGNOSIS — J4520 Mild intermittent asthma, uncomplicated: Secondary | ICD-10-CM

## 2016-08-08 ENCOUNTER — Other Ambulatory Visit: Payer: Self-pay | Admitting: Family Medicine

## 2016-08-08 DIAGNOSIS — J4520 Mild intermittent asthma, uncomplicated: Secondary | ICD-10-CM

## 2016-08-08 NOTE — Telephone Encounter (Signed)
°  Relation to KG:MWNUpt:self Call back number:[phone] [id number] [place] [address] [phone] (Phone) [phone] (Fax)     Reason for call:  Patient states he has enough puffs of PROAIR HFA 108 (90 Base) MCG/ACT inhaler but noticed he doesn't have any refills, requesting refills

## 2016-08-09 MED ORDER — ALBUTEROL SULFATE HFA 108 (90 BASE) MCG/ACT IN AERS: 2.0000 | INHALATION_SPRAY | RESPIRATORY_TRACT | 6 refills | Status: DC | PRN

## 2016-08-09 NOTE — Telephone Encounter (Signed)
Sent in

## 2017-01-18 ENCOUNTER — Telehealth: Payer: Self-pay | Admitting: Family Medicine

## 2017-01-18 NOTE — Telephone Encounter (Signed)
Pt says that he breaks out when he shave so he would like to know if provider could provide him with a note excusing him from shaving for work. Pt says that shaving is a requirement for work.    Pt CB: [phone]

## 2017-01-21 NOTE — Telephone Encounter (Signed)
Pt called in to check the status of his letter. Advised that I would resend message to provider.    Please assist further.

## 2017-01-23 NOTE — Telephone Encounter (Signed)
There's no set smart set in the system that I know of that restricts shaving. I will type up a letter to help Pt in any way possible however I'm unsure of exactly what he needs. I have called the Pt twice with no answer and his voicemail isn't set up to receive calls. This delay in communication will further delay Pt receiving the letter he's requesting.

## 2017-02-16 ENCOUNTER — Other Ambulatory Visit: Payer: Self-pay | Admitting: Family Medicine

## 2017-02-16 DIAGNOSIS — J4520 Mild intermittent asthma, uncomplicated: Secondary | ICD-10-CM

## 2017-03-09 ENCOUNTER — Other Ambulatory Visit: Payer: Self-pay | Admitting: Family Medicine

## 2017-03-09 DIAGNOSIS — J4520 Mild intermittent asthma, uncomplicated: Secondary | ICD-10-CM

## 2017-03-16 ENCOUNTER — Other Ambulatory Visit: Payer: Self-pay | Admitting: Family Medicine

## 2017-03-16 DIAGNOSIS — J4520 Mild intermittent asthma, uncomplicated: Secondary | ICD-10-CM

## 2017-04-01 ENCOUNTER — Other Ambulatory Visit: Payer: Self-pay | Admitting: Family Medicine

## 2017-04-01 NOTE — Telephone Encounter (Signed)
Copied from CRM [phone]. Topic: Quick Communication - See Telephone Encounter >> Apr 01, 2017  9:45 AM Arlyss Gandyichardson, [place] N, NT wrote: CRM for notification. See Telephone encounter for: Pt needing a refill of Proair. [facility] [address]   04/01/17.

## 2017-04-01 NOTE — Telephone Encounter (Signed)
Proair refilled this month already.

## 2017-04-04 ENCOUNTER — Other Ambulatory Visit: Payer: Self-pay | Admitting: Family Medicine

## 2017-04-04 DIAGNOSIS — J4520 Mild intermittent asthma, uncomplicated: Secondary | ICD-10-CM

## 2017-04-22 ENCOUNTER — Ambulatory Visit: Payer: BLUE CROSS/BLUE SHIELD | Admitting: Family Medicine

## 2017-04-22 ENCOUNTER — Encounter: Payer: Self-pay | Admitting: Family Medicine

## 2017-04-22 DIAGNOSIS — J4520 Mild intermittent asthma, uncomplicated: Secondary | ICD-10-CM | POA: Diagnosis not present

## 2017-04-22 DIAGNOSIS — R030 Elevated blood-pressure reading, without diagnosis of hypertension: Secondary | ICD-10-CM | POA: Insufficient documentation

## 2017-04-22 DIAGNOSIS — K219 Gastro-esophageal reflux disease without esophagitis: Secondary | ICD-10-CM | POA: Diagnosis not present

## 2017-04-22 LAB — LIPID PANEL
CHOLESTEROL: 203 mg/dL — AB (ref 0–200)
HDL: 47 mg/dL (ref >39.00)
LDL Cholesterol: 121 mg/dL — ABNORMAL HIGH (ref 0–99)
NonHDL: 156.11
Total CHOL/HDL Ratio: 4
Triglycerides: 176 mg/dL — ABNORMAL HIGH (ref 0.0–149.0)
VLDL: 35.2 mg/dL (ref 0.0–40.0)

## 2017-04-22 LAB — CBC WITH DIFFERENTIAL/PLATELET
Basophils Absolute: 0 10*3/uL (ref 0.0–0.1)
Basophils Relative: 0.7 % (ref 0.0–3.0)
EOS ABS: 0.2 10*3/uL (ref 0.0–0.7)
Eosinophils Relative: 3.6 % (ref 0.0–5.0)
HCT: 46.9 % (ref 39.0–52.0)
HEMOGLOBIN: 15.3 g/dL (ref 13.0–17.0)
Lymphocytes Relative: 31.8 % (ref 12.0–46.0)
Lymphs Abs: 2 10*3/uL (ref 0.7–4.0)
MCHC: 32.6 g/dL (ref 30.0–36.0)
MCV: 81.7 fl (ref 78.0–100.0)
MONO ABS: 0.5 10*3/uL (ref 0.1–1.0)
Monocytes Relative: 7.7 % (ref 3.0–12.0)
NEUTROS ABS: 3.5 10*3/uL (ref 1.4–7.7)
Neutrophils Relative %: 56.2 % (ref 43.0–77.0)
PLATELETS: 297 10*3/uL (ref 150.0–400.0)
RBC: 5.74 Mil/uL (ref 4.22–5.81)
RDW: 15 % (ref 11.5–15.5)
WBC: 6.2 10*3/uL (ref 4.0–10.5)

## 2017-04-22 LAB — COMPREHENSIVE METABOLIC PANEL
ALT: 42 U/L (ref 0–53)
AST: 26 U/L (ref 0–37)
Albumin: 4.2 g/dL (ref 3.5–5.2)
Alkaline Phosphatase: 65 U/L (ref 39–117)
BUN: 15 mg/dL (ref 6–23)
CHLORIDE: 103 meq/L (ref 96–112)
CO2: 30 meq/L (ref 19–32)
CREATININE: 1.04 mg/dL (ref 0.40–1.50)
Calcium: 9.2 mg/dL (ref 8.4–10.5)
GFR: 105.64 mL/min (ref >60.00)
GLUCOSE: 87 mg/dL (ref 70–99)
Potassium: 4.2 mEq/L (ref 3.5–5.1)
SODIUM: 139 meq/L (ref 135–145)
Total Bilirubin: 0.5 mg/dL (ref 0.2–1.2)
Total Protein: 7 g/dL (ref 6.0–8.3)

## 2017-04-22 LAB — H. PYLORI ANTIBODY, IGG: H PYLORI IGG: NEGATIVE

## 2017-04-22 MED ORDER — ALBUTEROL SULFATE HFA 108 (90 BASE) MCG/ACT IN AERS: INHALATION_SPRAY | RESPIRATORY_TRACT | 1 refills | Status: DC

## 2017-04-22 MED ORDER — FLUTICASONE PROPIONATE HFA 110 MCG/ACT IN AERO: 2.0000 | INHALATION_SPRAY | Freq: Two times a day (BID) | RESPIRATORY_TRACT | 12 refills | Status: DC

## 2017-04-22 MED ORDER — BECLOMETHASONE DIPROPIONATE 80 MCG/ACT IN AERS: 2.0000 | INHALATION_SPRAY | Freq: Two times a day (BID) | RESPIRATORY_TRACT | 12 refills | Status: DC

## 2017-04-22 NOTE — Patient Instructions (Addendum)
Food Choices for Gastroesophageal Reflux Disease, Adult When you have gastroesophageal reflux disease (GERD), the foods you eat and your eating habits are very important. Choosing the right foods can help ease your discomfort. What guidelines do I need to follow?  Choose fruits, vegetables, whole grains, and low-fat dairy products.  Choose low-fat meat, fish, and poultry.  Limit fats such as oils, salad dressings, butter, nuts, and avocado.  Keep a food diary. This helps you identify foods that cause symptoms.  Avoid foods that cause symptoms. These may be different for everyone.  Eat small meals often instead of 3 large meals a day.  Eat your meals slowly, in a place where you are relaxed.  Limit fried foods.  Cook foods using methods other than frying.  Avoid drinking alcohol.  Avoid drinking large amounts of liquids with your meals.  Avoid bending over or lying down until 2-3 hours after eating. What foods are not recommended? These are some foods and drinks that may make your symptoms worse: Vegetables Tomatoes. Tomato juice. Tomato and spaghetti sauce. Chili peppers. Onion and garlic. Horseradish. Fruits Oranges, grapefruit, and lemon (fruit and juice). Meats High-fat meats, fish, and poultry. This includes hot dogs, ribs, ham, sausage, salami, and bacon. Dairy Whole milk and chocolate milk. Sour cream. Cream. Butter. Ice cream. Cream cheese. Drinks Coffee and tea. Bubbly (carbonated) drinks or energy drinks. Condiments Hot sauce. Barbecue sauce. Sweets/Desserts Chocolate and cocoa. Donuts. Peppermint and spearmint. Fats and Oils High-fat foods. This includes French fries and potato chips. Other Vinegar. Strong spices. This includes black pepper, white pepper, red pepper, cayenne, curry powder, cloves, ginger, and chili powder. The items listed above may not be a complete list of foods and drinks to avoid. Contact your dietitian for more information. This  information is not intended to replace advice given to you by your health care provider. Make sure you discuss any questions you have with your health care provider. Document Released: 09/18/2011 Document Revised: 08/25/2015 Document Reviewed: 01/21/2013 Elsevier Interactive Patient Education  2017 [employer] DASH Eating Plan DASH stands for "Dietary Approaches to Stop Hypertension." The DASH eating plan is a healthy eating plan that has been shown to reduce high blood pressure (hypertension). It may also reduce your risk for type 2 diabetes, heart disease, and stroke. The DASH eating plan may also help with weight loss. What are tips for following this plan? General guidelines  Avoid eating more than 2,300 mg (milligrams) of salt (sodium) a day. If you have hypertension, you may need to reduce your sodium intake to 1,500 mg a day.  Limit alcohol intake to no more than 1 drink a day for nonpregnant women and 2 drinks a day for men. One drink equals 12 oz of beer, 5 oz of wine, or 1 oz of hard liquor.  Work with your health care provider to maintain a healthy body weight or to lose weight. Ask what an ideal weight is for you.  Get at least 30 minutes of exercise that causes your heart to beat faster (aerobic exercise) most days of the week. Activities may include walking, swimming, or biking.  Work with your health care provider or diet and nutrition specialist (dietitian) to adjust your eating plan to your individual calorie needs. Reading food labels  Check food labels for the amount of sodium per serving. Choose foods with less than 5 percent of the Daily Value of sodium. Generally, foods with less than 300 mg of sodium per serving fit into this   eating plan.  To find whole grains, look for the word "whole" as the first word in the ingredient list. Shopping  Buy products labeled as "low-sodium" or "no salt added."  Buy fresh foods. Avoid canned foods and premade or frozen  meals. Cooking  Avoid adding salt when cooking. Use salt-free seasonings or herbs instead of table salt or sea salt. Check with your health care provider or pharmacist before using salt substitutes.  Do not fry foods. Cook foods using healthy methods such as baking, boiling, grilling, and broiling instead.  Cook with heart-healthy oils, such as olive, canola, soybean, or sunflower oil. Meal planning   Eat a balanced diet that includes: ? 5 or more servings of fruits and vegetables each day. At each meal, try to fill half of your plate with fruits and vegetables. ? Up to 6-8 servings of whole grains each day. ? Less than 6 oz of lean meat, poultry, or fish each day. A 3-oz serving of meat is about the same size as a deck of cards. One egg equals 1 oz. ? 2 servings of low-fat dairy each day. ? A serving of nuts, seeds, or beans 5 times each week. ? Heart-healthy fats. Healthy fats called Omega-3 fatty acids are found in foods such as flaxseeds and coldwater fish, like sardines, salmon, and mackerel.  Limit how much you eat of the following: ? Canned or prepackaged foods. ? Food that is high in trans fat, such as fried foods. ? Food that is high in saturated fat, such as fatty meat. ? Sweets, desserts, sugary drinks, and other foods with added sugar. ? Full-fat dairy products.  Do not salt foods before eating.  Try to eat at least 2 vegetarian meals each week.  Eat more home-cooked food and less restaurant, buffet, and fast food.  When eating at a restaurant, ask that your food be prepared with less salt or no salt, if possible. What foods are recommended? The items listed may not be a complete list. Talk with your dietitian about what dietary choices are best for you. Grains Whole-grain or whole-wheat bread. Whole-grain or whole-wheat pasta. Brown rice. Oatmeal. Quinoa. Bulgur. Whole-grain and low-sodium cereals. Pita bread. Low-fat, low-sodium crackers. Whole-wheat flour  tortillas. Vegetables Fresh or frozen vegetables (raw, steamed, roasted, or grilled). Low-sodium or reduced-sodium tomato and vegetable juice. Low-sodium or reduced-sodium tomato sauce and tomato paste. Low-sodium or reduced-sodium canned vegetables. Fruits All fresh, dried, or frozen fruit. Canned fruit in natural juice (without added sugar). Meat and other protein foods Skinless chicken or turkey. Ground chicken or turkey. Pork with fat trimmed off. Fish and seafood. Egg whites. Dried beans, peas, or lentils. Unsalted nuts, nut butters, and seeds. Unsalted canned beans. Lean cuts of beef with fat trimmed off. Low-sodium, lean deli meat. Dairy Low-fat (1%) or fat-free (skim) milk. Fat-free, low-fat, or reduced-fat cheeses. Nonfat, low-sodium ricotta or cottage cheese. Low-fat or nonfat yogurt. Low-fat, low-sodium cheese. Fats and oils Soft margarine without trans fats. Vegetable oil. Low-fat, reduced-fat, or light mayonnaise and salad dressings (reduced-sodium). Canola, safflower, olive, soybean, and sunflower oils. Avocado. Seasoning and other foods Herbs. Spices. Seasoning mixes without salt. Unsalted popcorn and pretzels. Fat-free sweets. What foods are not recommended? The items listed may not be a complete list. Talk with your dietitian about what dietary choices are best for you. Grains Baked goods made with fat, such as croissants, muffins, or some breads. Dry pasta or rice meal packs. Vegetables Creamed or fried vegetables. Vegetables in a cheese sauce.   Regular canned vegetables (not low-sodium or reduced-sodium). Regular canned tomato sauce and paste (not low-sodium or reduced-sodium). Regular tomato and vegetable juice (not low-sodium or reduced-sodium). Pickles. Olives. Fruits Canned fruit in a light or heavy syrup. Fried fruit. Fruit in cream or butter sauce. Meat and other protein foods Fatty cuts of meat. Ribs. Fried meat. Bacon. Sausage. Bologna and other processed lunch meats.  Salami. Fatback. Hotdogs. Bratwurst. Salted nuts and seeds. Canned beans with added salt. Canned or smoked fish. Whole eggs or egg yolks. Chicken or turkey with skin. Dairy Whole or 2% milk, cream, and half-and-half. Whole or full-fat cream cheese. Whole-fat or sweetened yogurt. Full-fat cheese. Nondairy creamers. Whipped toppings. Processed cheese and cheese spreads. Fats and oils Butter. Stick margarine. Lard. Shortening. Ghee. Bacon fat. Tropical oils, such as coconut, palm kernel, or palm oil. Seasoning and other foods Salted popcorn and pretzels. Onion salt, garlic salt, seasoned salt, table salt, and sea salt. Worcestershire sauce. Tartar sauce. Barbecue sauce. Teriyaki sauce. Soy sauce, including reduced-sodium. Steak sauce. Canned and packaged gravies. Fish sauce. Oyster sauce. Cocktail sauce. Horseradish that you find on the shelf. Ketchup. Mustard. Meat flavorings and tenderizers. Bouillon cubes. Hot sauce and Tabasco sauce. Premade or packaged marinades. Premade or packaged taco seasonings. Relishes. Regular salad dressings. Where to find more information:  National Heart, Lung, and Blood Institute: www.nhlbi.nih.gov  American Heart Association: www.heart.org Summary  The DASH eating plan is a healthy eating plan that has been shown to reduce high blood pressure (hypertension). It may also reduce your risk for type 2 diabetes, heart disease, and stroke.  With the DASH eating plan, you should limit salt (sodium) intake to 2,300 mg a day. If you have hypertension, you may need to reduce your sodium intake to 1,500 mg a day.  When on the DASH eating plan, aim to eat more fresh fruits and vegetables, whole grains, lean proteins, low-fat dairy, and heart-healthy fats.  Work with your health care provider or diet and nutrition specialist (dietitian) to adjust your eating plan to your individual calorie needs. This information is not intended to replace advice given to you by your health  care provider. Make sure you discuss any questions you have with your health care provider. Document Released: 03/08/2011 Document Revised: 03/12/2016 Document Reviewed: 03/12/2016 Elsevier Interactive Patient Education  2018 [employer] Inc.  

## 2017-04-22 NOTE — Progress Notes (Signed)
Patient ID: William Zimmerman, male   DOB: 10-12-1985, 32 y.o.   MRN: [id number]     Subjective:  I acted as a Neurosurgeon for Dr. Zola Button.  Apolonio Schneiders, CMA   Patient ID: William Zimmerman, male    DOB: 22-May-1985, 32 y.o.   MRN: [id number]  Chief Complaint  Patient presents with  . Gastroesophageal Reflux    Gastroesophageal Reflux  He complains of choking (middle of the night) and heartburn. He reports no abdominal pain, no belching, no chest pain, no dysphagia or no nausea. This is a new problem. Episode onset: 2 months. The problem occurs occasionally. The heartburn duration is an hour. The heartburn is located in the substernum. The symptoms are aggravated by ETOH and lying down. He has tried an antacid (zantac) for the symptoms.  he also need s refills on his asthma meds--- he has been using his albuterol 8x a day--- we discussed proper use of proair and he had not been using his qvar due to cost but the ins coverage has changed some so it may be better.  He will call if it is still too much.   Patient is in today for acid reflux.    Patient Care Team: Zola Button, Grayling Congress, DO as PCP - General (Family Medicine)   Past Medical History:  Diagnosis Date  . Asthma   . Seasonal allergies     No past surgical history on file.  Family History  Problem Relation Age of Onset  . Diabetes Mother   . Arthritis Mother   . Diabetes Maternal Grandfather   . Arthritis Maternal Grandfather   . Hypertension Father     Social History   Socioeconomic History  . Marital status: Married    Spouse name: Not on file  . Number of children: Not on file  . Years of education: Not on file  . Highest education level: Not on file  Social Needs  . Financial resource strain: Not on file  . Food insecurity - worry: Not on file  . Food insecurity - inability: Not on file  . Transportation needs - medical: Not on file  . Transportation needs - non-medical: Not on file  Occupational History  . Not on  file  Tobacco Use  . Smoking status: Current Some Day Smoker  . Smokeless tobacco: Never Used  Substance and Sexual Activity  . Alcohol use: Yes    Alcohol/week: 2.4 oz    Types: 4 Cans of beer per week    Comment: uses on the weekends only  . Drug use: Yes    Types: Marijuana  . Sexual activity: Not on file  Other Topics Concern  . Not on file  Social History Narrative  . Not on file    Outpatient Medications Prior to Visit  Medication Sig Dispense Refill  . beclomethasone (QVAR) 80 MCG/ACT inhaler Inhale 2 puffs into the lungs 2 (two) times daily. 1 Inhaler 12  . PROAIR HFA 108 (90 Base) MCG/ACT inhaler INHALE 2 PUFFS INTO THE LUNGS EVERY 4 HOURS AS NEEDED FOR WHEEZING OR SHORTNESS OF BREATH 8.5 g 0   No facility-administered medications prior to visit.     No Known Allergies  Review of Systems  Constitutional: Negative for fever and malaise/fatigue.  HENT: Negative for congestion.   Eyes: Negative for blurred vision.  Respiratory: Positive for choking (middle of the night). Negative for shortness of breath.   Cardiovascular: Negative for chest pain, palpitations and leg swelling.  Gastrointestinal: Positive for  heartburn. Negative for abdominal pain, blood in stool, dysphagia and nausea.  Genitourinary: Negative for dysuria and frequency.  Musculoskeletal: Negative for falls.  Skin: Negative for rash.  Neurological: Negative for dizziness, loss of consciousness and headaches.  Endo/Heme/Allergies: Negative for environmental allergies.  Psychiatric/Behavioral: Negative for depression. The patient is not nervous/anxious.        Objective:    Physical Exam  Constitutional: He is oriented to person, place, and time. Vital signs are normal. He appears well-developed and well-nourished. He is sleeping.  HENT:  Head: Normocephalic and atraumatic.  Mouth/Throat: Oropharynx is clear and moist.  Eyes: EOM are normal. Pupils are equal, round, and reactive to light.  Neck:  Normal range of motion. Neck supple. No thyromegaly present.  Cardiovascular: Normal rate and regular rhythm.  No murmur heard. Pulmonary/Chest: Effort normal and breath sounds normal. No respiratory distress. He has no wheezes. He has no rales. He exhibits no tenderness.  Musculoskeletal: He exhibits no edema or tenderness.  Neurological: He is alert and oriented to person, place, and time.  Skin: Skin is warm and dry.  Psychiatric: He has a normal mood and affect. His behavior is normal. Judgment and thought content normal.  Nursing note and vitals reviewed.   BP 132/90   Pulse 78   Temp 98 F (36.7 C) (Oral)   Resp 16   Ht 6' (1.829 m)   Wt 251 lb 3.2 oz (113.9 kg)   SpO2 98%   BMI 34.07 kg/m  Wt Readings from Last 3 Encounters:  04/22/17 251 lb 3.2 oz (113.9 kg)  04/23/16 219 lb 6.4 oz (99.5 kg)  02/18/15 220 lb (99.8 kg)   BP Readings from Last 3 Encounters:  04/22/17 132/90  04/23/16 130/88  02/18/15 138/90     Immunization History  Administered Date(s) Administered  . Influenza,inj,Quad PF,6+ Mos 02/05/2014, 04/23/2016  . Tdap 02/18/2015    Health Maintenance  Topic Date Due  . HIV Screening  02/04/1999  . INFLUENZA VACCINE  10/31/2016  . TETANUS/TDAP  02/17/2025    Lab Results  Component Value Date   WBC 8.6 04/23/2016   HGB 15.3 04/23/2016   HCT 45.2 04/23/2016   PLT 272.0 04/23/2016   GLUCOSE 121 (H) 04/23/2016   CHOL 230 (H) 04/23/2016   TRIG 150.0 (H) 04/23/2016   HDL 76.20 04/23/2016   LDLCALC 123 (H) 04/23/2016   ALT 47 04/23/2016   AST 30 04/23/2016   NA 140 04/23/2016   K 4.3 04/23/2016   CL 106 04/23/2016   CREATININE 1.14 04/23/2016   BUN 19 04/23/2016   CO2 29 04/23/2016   TSH 0.60 04/23/2016    Lab Results  Component Value Date   TSH 0.60 04/23/2016   Lab Results  Component Value Date   WBC 8.6 04/23/2016   HGB 15.3 04/23/2016   HCT 45.2 04/23/2016   MCV 81.5 04/23/2016   PLT 272.0 04/23/2016   Lab Results  Component  Value Date   NA 140 04/23/2016   K 4.3 04/23/2016   CO2 29 04/23/2016   GLUCOSE 121 (H) 04/23/2016   BUN 19 04/23/2016   CREATININE 1.14 04/23/2016   BILITOT 0.3 04/23/2016   ALKPHOS 57 04/23/2016   AST 30 04/23/2016   ALT 47 04/23/2016   PROT 7.7 04/23/2016   ALBUMIN 4.8 04/23/2016   CALCIUM 9.5 04/23/2016   GFR 95.61 04/23/2016   Lab Results  Component Value Date   CHOL 230 (H) 04/23/2016   Lab Results  Component Value  Date   HDL 76.20 04/23/2016   Lab Results  Component Value Date   LDLCALC 123 (H) 04/23/2016   Lab Results  Component Value Date   TRIG 150.0 (H) 04/23/2016   Lab Results  Component Value Date   CHOLHDL 3 04/23/2016   No results found for: HGBA1C       Assessment & Plan:   Problem List Items Addressed This Visit      Unprioritized   Elevated BP without diagnosis of hypertension    Dash diet Recheck 2-3 weeks bp check       Relevant Orders   CBC with Differential/Platelet   H. pylori antibody, IgG   Lipid panel   Comprehensive metabolic panel   GERD (gastroesophageal reflux disease)    Diet HO given to pt  Can use zantac or omeprazole  But if symptoms con't rto       Relevant Orders   CBC with Differential/Platelet   H. pylori antibody, IgG   Lipid panel   Comprehensive metabolic panel    Other Visit Diagnoses    Mild intermittent asthma without complication       Relevant Medications   beclomethasone (QVAR) 80 MCG/ACT inhaler   albuterol (PROAIR HFA) 108 (90 Base) MCG/ACT inhaler   fluticasone (FLOVENT HFA) 110 MCG/ACT inhaler      I have changed Chales Wambolt's PROAIR HFA to albuterol. I am also having him start on fluticasone. Additionally, I am having him maintain his beclomethasone.  Meds ordered this encounter  Medications  . beclomethasone (QVAR) 80 MCG/ACT inhaler    Sig: Inhale 2 puffs into the lungs 2 (two) times daily.    Dispense:  1 Inhaler    Refill:  12  . albuterol (PROAIR HFA) 108 (90 Base) MCG/ACT  inhaler    Sig: INHALE 2 PUFFS INTO THE LUNGS EVERY 4 HOURS AS NEEDED FOR WHEEZING OR SHORTNESS OF BREATH    Dispense:  8.5 g    Refill:  1  . fluticasone (FLOVENT HFA) 110 MCG/ACT inhaler    Sig: Inhale 2 puffs into the lungs 2 (two) times daily.    Dispense:  1 Inhaler    Refill:  12    CMA served as scribe during this visit. History, Physical and Plan performed by medical provider. Documentation and orders reviewed and attested to.  Donato SchultzYvonne R Lowne Chase, DO

## 2017-04-22 NOTE — Assessment & Plan Note (Signed)
Dash diet Recheck 2-3 weeks bp check

## 2017-04-22 NOTE — Assessment & Plan Note (Signed)
Diet HO given to pt  Can use zantac or omeprazole  But if symptoms con't rto

## 2017-05-13 ENCOUNTER — Ambulatory Visit: Payer: BLUE CROSS/BLUE SHIELD | Admitting: Family Medicine

## 2017-05-13 ENCOUNTER — Encounter: Payer: Self-pay | Admitting: Family Medicine

## 2017-05-13 DIAGNOSIS — J45998 Other asthma: Secondary | ICD-10-CM | POA: Diagnosis not present

## 2017-05-13 NOTE — Patient Instructions (Addendum)
DASH Eating Plan DASH stands for "Dietary Approaches to Stop Hypertension." The DASH eating plan is a healthy eating plan that has been shown to reduce high blood pressure (hypertension). It may also reduce your risk for type 2 diabetes, heart disease, and stroke. The DASH eating plan may also help with weight loss. What are tips for following this plan? General guidelines  Avoid eating more than 2,300 mg (milligrams) of salt (sodium) a day. If you have hypertension, you may need to reduce your sodium intake to 1,500 mg a day.  Limit alcohol intake to no more than 1 drink a day for nonpregnant women and 2 drinks a day for men. One drink equals 12 oz of beer, 5 oz of wine, or 1 oz of hard liquor.  Work with your health care provider to maintain a healthy body weight or to lose weight. Ask what an ideal weight is for you.  Get at least 30 minutes of exercise that causes your heart to beat faster (aerobic exercise) most days of the week. Activities may include walking, swimming, or biking.  Work with your health care provider or diet and nutrition specialist (dietitian) to adjust your eating plan to your individual calorie needs. Reading food labels  Check food labels for the amount of sodium per serving. Choose foods with less than 5 percent of the Daily Value of sodium. Generally, foods with less than 300 mg of sodium per serving fit into this eating plan.  To find whole grains, look for the word "whole" as the first word in the ingredient list. Shopping  Buy products labeled as "low-sodium" or "no salt added."  Buy fresh foods. Avoid canned foods and premade or frozen meals. Cooking  Avoid adding salt when cooking. Use salt-free seasonings or herbs instead of table salt or sea salt. Check with your health care provider or pharmacist before using salt substitutes.  Do not fry foods. Cook foods using healthy methods such as baking, boiling, grilling, and broiling instead.  Cook with  heart-healthy oils, such as olive, canola, soybean, or sunflower oil. Meal planning   Eat a balanced diet that includes: ? 5 or more servings of fruits and vegetables each day. At each meal, try to fill half of your plate with fruits and vegetables. ? Up to 6-8 servings of whole grains each day. ? Less than 6 oz of lean meat, poultry, or fish each day. A 3-oz serving of meat is about the same size as a deck of cards. One egg equals 1 oz. ? 2 servings of low-fat dairy each day. ? A serving of nuts, seeds, or beans 5 times each week. ? Heart-healthy fats. Healthy fats called Omega-3 fatty acids are found in foods such as flaxseeds and coldwater fish, like sardines, salmon, and mackerel.  Limit how much you eat of the following: ? Canned or prepackaged foods. ? Food that is high in trans fat, such as fried foods. ? Food that is high in saturated fat, such as fatty meat. ? Sweets, desserts, sugary drinks, and other foods with added sugar. ? Full-fat dairy products.  Do not salt foods before eating.  Try to eat at least 2 vegetarian meals each week.  Eat more home-cooked food and less restaurant, buffet, and fast food.  When eating at a restaurant, ask that your food be prepared with less salt or no salt, if possible. What foods are recommended? The items listed may not be a complete list. Talk with your dietitian about what   dietary choices are best for you. Grains Whole-grain or whole-wheat bread. Whole-grain or whole-wheat pasta. Brown rice. Oatmeal. Quinoa. Bulgur. Whole-grain and low-sodium cereals. Pita bread. Low-fat, low-sodium crackers. Whole-wheat flour tortillas. Vegetables Fresh or frozen vegetables (raw, steamed, roasted, or grilled). Low-sodium or reduced-sodium tomato and vegetable juice. Low-sodium or reduced-sodium tomato sauce and tomato paste. Low-sodium or reduced-sodium canned vegetables. Fruits All fresh, dried, or frozen fruit. Canned fruit in natural juice (without  added sugar). Meat and other protein foods Skinless chicken or turkey. Ground chicken or turkey. Pork with fat trimmed off. Fish and seafood. Egg whites. Dried beans, peas, or lentils. Unsalted nuts, nut butters, and seeds. Unsalted canned beans. Lean cuts of beef with fat trimmed off. Low-sodium, lean deli meat. Dairy Low-fat (1%) or fat-free (skim) milk. Fat-free, low-fat, or reduced-fat cheeses. Nonfat, low-sodium ricotta or cottage cheese. Low-fat or nonfat yogurt. Low-fat, low-sodium cheese. Fats and oils Soft margarine without trans fats. Vegetable oil. Low-fat, reduced-fat, or light mayonnaise and salad dressings (reduced-sodium). Canola, safflower, olive, soybean, and sunflower oils. Avocado. Seasoning and other foods Herbs. Spices. Seasoning mixes without salt. Unsalted popcorn and pretzels. Fat-free sweets. What foods are not recommended? The items listed may not be a complete list. Talk with your dietitian about what dietary choices are best for you. Grains Baked goods made with fat, such as croissants, muffins, or some breads. Dry pasta or rice meal packs. Vegetables Creamed or fried vegetables. Vegetables in a cheese sauce. Regular canned vegetables (not low-sodium or reduced-sodium). Regular canned tomato sauce and paste (not low-sodium or reduced-sodium). Regular tomato and vegetable juice (not low-sodium or reduced-sodium). Pickles. Olives. Fruits Canned fruit in a light or heavy syrup. Fried fruit. Fruit in cream or butter sauce. Meat and other protein foods Fatty cuts of meat. Ribs. Fried meat. Bacon. Sausage. Bologna and other processed lunch meats. Salami. Fatback. Hotdogs. Bratwurst. Salted nuts and seeds. Canned beans with added salt. Canned or smoked fish. Whole eggs or egg yolks. Chicken or turkey with skin. Dairy Whole or 2% milk, cream, and half-and-half. Whole or full-fat cream cheese. Whole-fat or sweetened yogurt. Full-fat cheese. Nondairy creamers. Whipped toppings.  Processed cheese and cheese spreads. Fats and oils Butter. Stick margarine. Lard. Shortening. Ghee. Bacon fat. Tropical oils, such as coconut, palm kernel, or palm oil. Seasoning and other foods Salted popcorn and pretzels. Onion salt, garlic salt, seasoned salt, table salt, and sea salt. Worcestershire sauce. Tartar sauce. Barbecue sauce. Teriyaki sauce. Soy sauce, including reduced-sodium. Steak sauce. Canned and packaged gravies. Fish sauce. Oyster sauce. Cocktail sauce. Horseradish that you find on the shelf. Ketchup. Mustard. Meat flavorings and tenderizers. Bouillon cubes. Hot sauce and Tabasco sauce. Premade or packaged marinades. Premade or packaged taco seasonings. Relishes. Regular salad dressings. Where to find more information:  National Heart, Lung, and Blood Institute: www.nhlbi.nih.gov  American Heart Association: www.heart.org Summary  The DASH eating plan is a healthy eating plan that has been shown to reduce high blood pressure (hypertension). It may also reduce your risk for type 2 diabetes, heart disease, and stroke.  With the DASH eating plan, you should limit salt (sodium) intake to 2,300 mg a day. If you have hypertension, you may need to reduce your sodium intake to 1,500 mg a day.  When on the DASH eating plan, aim to eat more fresh fruits and vegetables, whole grains, lean proteins, low-fat dairy, and heart-healthy fats.  Work with your health care provider or diet and nutrition specialist (dietitian) to adjust your eating plan to your individual   calorie needs. This information is not intended to replace advice given to you by your health care provider. Make sure you discuss any questions you have with your health care provider. Document Released: 03/08/2011 Document Revised: 03/12/2016 Document Reviewed: 03/12/2016 Elsevier Interactive Patient Education  2018 [employer]vier Inc.    Ingrown Hair An ingrown hair is a hair that curls and re-enters the skin instead of  growing straight out of the skin. An ingrown hair can develop in any part of the skin that hair is removed from. An ingrown hair may cause small pockets of infection. What are the causes? An ingrown hair can be caused by:  Shaving.  Tweezing.  Waxing.  Using a hair removal cream.  What increases the risk? Ingrown hairs are more likely to develop in people who have curly hair. What are the signs or symptoms? Symptoms of an ingrown hair may include:  Small bumps on the skin. The bumps may be filled with pus.  Pain.  Itching.  How is this diagnosed? An ingrown hair is diagnosed with a skin exam. How is this treated? Treatment is often not needed unless the ingrown hair has caused an infection. Treatment may involve:  Applying prescription creams to the skin. This can help with inflammation.  Applying warm compresses to the skin. This can help soften the skin.  Taking antibiotic medicine. An antibiotic may be prescribed if the infection is severe.  Follow these instructions at home:  Do not shave irritated areas of skin. You may start shaving these areas again once the irritation has gone away.  Take, apply, or use over-the-counter and prescription medicines only as told by your health care provider. This includes any prescription creams.  If you were prescribed an antibiotic medicine, take it as told by your health care provider. Do not stop taking the antibiotic even if your condition improves.  To help remove ingrown hairs on your face, you may use a facial sponge in a gentle circular motion.  If directed, apply heat to the affected area. Use the heat source that your health care provider recommends, such as a moist heat pack or a heating pad. ? Place a towel between your skin and the heat source. ? Leave the heat on for 20-30 minutes. ? Remove the heat if your skin turns bright red. This is especially important if you are unable to feel pain, heat, or cold. You may have  a greater risk of getting burned. How is this prevented?  Shower before shaving.  Wrap areas that you are going to shave in warm, moist wraps for several minutes before shaving. The warmth and moisture helps to soften the hairs and makes ingrown hairs less likely.  Use thick shaving gels.  Use a razor that cuts hair slightly above your skin. Or, use an electric shaver with a long shave setting.  Shave in the direction of hair growth.  Avoid making multiple razor strokes.  Apply a moisturizing lotion after shaving. This information is not intended to replace advice given to you by your health care provider. Make sure you discuss any questions you have with your health care provider. Document Released: 06/25/2000 Document Revised: 10/07/2015 Document Reviewed: 01/07/2015 Elsevier Interactive Patient Education  2018 [employer]

## 2017-05-13 NOTE — Progress Notes (Signed)
Patient ID: William Zimmerman, male    DOB: 03/19/1986  Age: 32 y.o. MRN: [id number]    Subjective:  Subjective  HPI William Zimmerman presents for f/u bp, and asthma.  He is doing much better now that he is using his flovent   Review of Systems  Constitutional: Negative for appetite change, diaphoresis, fatigue and unexpected weight change.  Eyes: Negative for pain, redness and visual disturbance.  Respiratory: Negative for cough, chest tightness, shortness of breath and wheezing.   Cardiovascular: Negative for chest pain, palpitations and leg swelling.  Endocrine: Negative for cold intolerance, heat intolerance, polydipsia, polyphagia and polyuria.  Genitourinary: Negative for difficulty urinating, dysuria and frequency.  Neurological: Negative for dizziness, light-headedness, numbness and headaches.    History Past Medical History:  Diagnosis Date  . Asthma   . Seasonal allergies     He has no past surgical history on file.   His family history includes Arthritis in his maternal grandfather and mother; Diabetes in his maternal grandfather and mother; Hypertension in his father.He reports that he has been smoking.  he has never used smokeless tobacco. He reports that he drinks about 2.4 oz of alcohol per week. He reports that he uses drugs. Drug: Marijuana.  Current Outpatient Medications on File Prior to Visit  Medication Sig Dispense Refill  . albuterol (PROAIR HFA) 108 (90 Base) MCG/ACT inhaler INHALE 2 PUFFS INTO THE LUNGS EVERY 4 HOURS AS NEEDED FOR WHEEZING OR SHORTNESS OF BREATH 8.5 g 1  . Zimmerman (QVAR) 80 MCG/ACT inhaler Inhale 2 puffs into the lungs 2 (two) times daily. 1 Inhaler 12  . fluticasone (FLOVENT HFA) 110 MCG/ACT inhaler Inhale 2 puffs into the lungs 2 (two) times daily. 1 Inhaler 12  . [DISCONTINUED] ALBUTEROL IN Inhale into the lungs.     No current facility-administered medications on file prior to visit.      Objective:  Objective  Physical Exam    Constitutional: He is oriented to person, place, and time. Vital signs are normal. He appears well-developed and well-nourished. He is sleeping.  HENT:  Head: Normocephalic and atraumatic.  Mouth/Throat: Oropharynx is clear and moist.  Eyes: EOM are normal. Pupils are equal, round, and reactive to light.  Neck: Normal range of motion. Neck supple. No thyromegaly present.  Cardiovascular: Normal rate and regular rhythm.  No murmur heard. Pulmonary/Chest: Effort normal and breath sounds normal. No respiratory distress. He has no wheezes. He has no rales. He exhibits no tenderness.  Musculoskeletal: He exhibits no edema or tenderness.  Neurological: He is alert and oriented to person, place, and time.  Skin: Skin is warm and dry.  Psychiatric: He has a normal mood and affect. His behavior is normal. Judgment and thought content normal.  Nursing note and vitals reviewed.  BP 128/86 (BP Location: Left Arm, Cuff Size: Large)   Pulse 79   Temp 97.9 F (36.6 C) (Oral)   Resp 16   Ht 6' (1.829 m)   Wt 245 lb 9.6 oz (111.4 kg)   SpO2 98%   BMI 33.31 kg/m  Wt Readings from Last 3 Encounters:  05/13/17 245 lb 9.6 oz (111.4 kg)  04/22/17 251 lb 3.2 oz (113.9 kg)  04/23/16 219 lb 6.4 oz (99.5 kg)     Lab Results  Component Value Date   WBC 6.2 04/22/2017   HGB 15.3 04/22/2017   HCT 46.9 04/22/2017   PLT 297.0 04/22/2017   GLUCOSE 87 04/22/2017   CHOL 203 (H) 04/22/2017   TRIG  176.0 (H) 04/22/2017   HDL 47.00 04/22/2017   LDLCALC 121 (H) 04/22/2017   ALT 42 04/22/2017   AST 26 04/22/2017   NA 139 04/22/2017   K 4.2 04/22/2017   CL 103 04/22/2017   CREATININE 1.04 04/22/2017   BUN 15 04/22/2017   CO2 30 04/22/2017   TSH 0.60 04/23/2016    No results found.   Assessment & Plan:  Plan  I am having William Zimmerman, albuterol, and fluticasone.  No orders of the defined types were placed in this encounter.   Problem List Items Addressed This  Visit      Unprioritized   Uncontrolled persistent asthma    Stable con't flovent and prn albuterol         Follow-up: Return in about 3 months (around 08/10/2017) for hypertension, hyperlipidemia.  Donato Schultz, DO

## 2017-05-13 NOTE — Assessment & Plan Note (Signed)
Stable con't flovent and prn albuterol

## 2017-05-20 ENCOUNTER — Telehealth: Payer: Self-pay | Admitting: Family Medicine

## 2017-05-20 NOTE — Telephone Encounter (Signed)
Copied from CRM [phone]. Topic: Quick Communication - See Telephone Encounter >> May 20, 2017 12:04 PM Valentina LucksMatos, Jackelin wrote: CRM for notification. See Telephone encounter for:  05/20/17.   Pt dropped off document to be filled out by provider ([employer] Application for a Variance to [employer] based on health condition- 4 pages) Pt would like documents to be faxed to [phone] and to let him know when document faxed, pt tel [phone]. Document put at front office tray under providers name.

## 2017-05-29 ENCOUNTER — Other Ambulatory Visit: Payer: Self-pay | Admitting: Family Medicine

## 2017-05-29 DIAGNOSIS — J4520 Mild intermittent asthma, uncomplicated: Secondary | ICD-10-CM

## 2017-05-29 NOTE — Telephone Encounter (Signed)
Paperwork forwarded to provider/SLS 02/27

## 2017-06-04 ENCOUNTER — Encounter: Payer: Self-pay | Admitting: Family Medicine

## 2017-06-04 DIAGNOSIS — L738 Other specified follicular disorders: Secondary | ICD-10-CM | POA: Insufficient documentation

## 2017-07-09 ENCOUNTER — Other Ambulatory Visit: Payer: Self-pay | Admitting: Family Medicine

## 2017-07-09 DIAGNOSIS — J4520 Mild intermittent asthma, uncomplicated: Secondary | ICD-10-CM

## 2017-08-05 ENCOUNTER — Emergency Department (HOSPITAL_COMMUNITY)
Admission: EM | Admit: 2017-08-05 | Discharge: 2017-08-05 | Disposition: A | Discharge status: Home / Self Care | Payer: BLUE CROSS/BLUE SHIELD | Attending: Emergency Medicine | Admitting: Emergency Medicine

## 2017-08-05 ENCOUNTER — Other Ambulatory Visit: Payer: Self-pay

## 2017-08-05 ENCOUNTER — Encounter (HOSPITAL_COMMUNITY): Payer: Self-pay | Admitting: Emergency Medicine

## 2017-08-05 DIAGNOSIS — J4541 Moderate persistent asthma with (acute) exacerbation: Secondary | ICD-10-CM | POA: Diagnosis present

## 2017-08-05 DIAGNOSIS — J302 Other seasonal allergic rhinitis: Secondary | ICD-10-CM | POA: Insufficient documentation

## 2017-08-05 DIAGNOSIS — J45901 Unspecified asthma with (acute) exacerbation: Secondary | ICD-10-CM

## 2017-08-05 DIAGNOSIS — F17210 Nicotine dependence, cigarettes, uncomplicated: Secondary | ICD-10-CM | POA: Insufficient documentation

## 2017-08-05 DIAGNOSIS — Z79899 Other long term (current) drug therapy: Secondary | ICD-10-CM | POA: Insufficient documentation

## 2017-08-05 MED ORDER — ALBUTEROL SULFATE (2.5 MG/3ML) 0.083% IN NEBU
5.0000 mg | INHALATION_SOLUTION | Freq: Once | RESPIRATORY_TRACT | Status: AC
  Administered 2017-08-05: 5 mg via RESPIRATORY_TRACT
  Filled 2017-08-05: qty 6

## 2017-08-05 MED ORDER — ALBUTEROL (5 MG/ML) CONTINUOUS INHALATION SOLN
10.0000 mg/h | INHALATION_SOLUTION | Freq: Once | RESPIRATORY_TRACT | Status: AC
  Administered 2017-08-05: 10 mg/h via RESPIRATORY_TRACT
  Filled 2017-08-05: qty 20

## 2017-08-05 MED ORDER — ALBUTEROL SULFATE HFA 108 (90 BASE) MCG/ACT IN AERS: 2.0000 | INHALATION_SPRAY | RESPIRATORY_TRACT | 1 refills | Status: DC | PRN

## 2017-08-05 MED ORDER — PREDNISONE 20 MG PO TABS: 20.0000 mg | ORAL_TABLET | Freq: Two times a day (BID) | ORAL | 0 refills | Status: DC

## 2017-08-05 MED ORDER — PREDNISONE 20 MG PO TABS
60.0000 mg | ORAL_TABLET | Freq: Once | ORAL | Status: AC
  Administered 2017-08-05: 60 mg via ORAL
  Filled 2017-08-05: qty 3

## 2017-08-05 NOTE — ED Provider Notes (Signed)
[facility] EMERGENCY DEPARTMENT Provider Note   CSN: [id number] Arrival date & time: 08/05/17  0255     History   Chief Complaint Chief Complaint  Patient presents with  . Asthma    HPI William Zimmerman is a 32 y.o. male.  HPI    He presents for evaluation of trouble breathing, and being out of his albuterol inhaler.  Symptoms worse after eating at a [employer], around around a hibachi grill yesterday.  He is using his steroid inhaler.  He is bothered by seasonal allergy with rhinorrhea and sneezing.  He denies nausea, vomiting, fever, chills, weakness or dizziness.   Past Medical History:  Diagnosis Date  . Asthma   . Seasonal allergies     Patient Active Problem List   Diagnosis Date Noted  . Folliculitis barbae 06/04/2017  . Elevated BP without diagnosis of hypertension 04/22/2017  . GERD (gastroesophageal reflux disease) 04/22/2017  . Preventative health care 02/19/2015  . Uncontrolled persistent asthma 04/01/2013    History reviewed. No pertinent surgical history.      Home Medications    Prior to Admission medications   Medication Sig Start Date End Date Taking? Authorizing Provider  beclomethasone (QVAR) 80 MCG/ACT inhaler Inhale 2 puffs into the lungs 2 (two) times daily. 04/22/17   Seabron Spates R, DO  fluticasone (FLOVENT HFA) 110 MCG/ACT inhaler Inhale 2 puffs into the lungs 2 (two) times daily. 04/22/17   Zola Button, Yvonne R, DO  PROAIR HFA 108 [phone] Base) MCG/ACT inhaler INHALE 2 PUFFS INTO THE LUNGS EVERY 4 HOURS AS NEEDED FOR WHEEZING OR SHORTNESS OF BREATH 07/09/17   Zola Button, Myrene Buddy R, DO  ALBUTEROL IN Inhale into the lungs.  04/01/13  [provider]    Family History Family History  Problem Relation Age of Onset  . Diabetes Mother   . Arthritis Mother   . Hypertension Father   . Diabetes Maternal Grandfather   . Arthritis Maternal Grandfather     Social History Social History   Tobacco Use    . Smoking status: Current Some Day Smoker  . Smokeless tobacco: Never Used  Substance Use Topics  . Alcohol use: Yes    Alcohol/week: 2.4 oz    Types: 4 Cans of beer per week    Comment: uses on the weekends only  . Drug use: Yes    Types: Marijuana     Allergies   Patient has no known allergies.   Review of Systems Review of Systems  All other systems reviewed and are negative.    Physical Exam Updated Vital Signs BP (!) 157/91 (BP Location: Right Arm)   Pulse 96   Temp 98.2 F (36.8 C) (Oral)   Resp 20   SpO2 100%   Physical Exam  Constitutional: He is oriented to person, place, and time. He appears well-developed and well-nourished. He appears distressed (He is uncomfortable).  HENT:  Head: Normocephalic and atraumatic.  Right Ear: External ear normal.  Left Ear: External ear normal.  Eyes: Pupils are equal, round, and reactive to light. Conjunctivae and EOM are normal.  Neck: Normal range of motion and phonation normal. Neck supple.  Cardiovascular: Normal rate, regular rhythm and normal heart sounds.  Pulmonary/Chest: Breath sounds normal. He is in respiratory distress (Mild increased work of breathing). He has no wheezes. He exhibits no tenderness and no bony tenderness.  Decreased expiratory air movement.  Abdominal: Soft. There is no tenderness.  Musculoskeletal: Normal range of motion.  Neurological: He is alert and oriented to person, place, and time. No cranial nerve deficit or sensory deficit. He exhibits normal muscle tone. Coordination normal.  Skin: Skin is warm, dry and intact.  Psychiatric: He has a normal mood and affect. His behavior is normal. Judgment and thought content normal.  Nursing note and vitals reviewed.    ED Treatments / Results  Labs (all labs ordered are listed, but only abnormal results are displayed) Labs Reviewed - No data to display  EKG None  Radiology No results found.  Procedures Procedures (including critical  care time)  Medications Ordered in ED Medications  albuterol (PROVENTIL) (2.5 MG/3ML) 0.083% nebulizer solution 5 mg (5 mg Nebulization Given 08/05/17 0310)  predniSONE (DELTASONE) tablet 60 mg (60 mg Oral Given 08/05/17 0856)  albuterol (PROVENTIL,VENTOLIN) solution continuous neb (10 mg/hr Nebulization Given 08/05/17 1610)     Initial Impression / Assessment and Plan / ED Course  I have reviewed the triage vital signs and the nursing notes.  Pertinent labs & imaging results that were available during my care of the patient were reviewed by me and considered in my medical decision making (see chart for details).      Patient Vitals for the past 24 hrs:  BP Temp Temp src Pulse Resp SpO2  08/05/17 0840 - - - - - 100 %  08/05/17 0636 (!) 157/91 98.2 F (36.8 C) Oral 96 20 97 %  08/05/17 0307 - - - - - 97 %  08/05/17 0305 (!) 157/99 97.9 F (36.6 C) Oral (!) 104 18 97 %    10:34 AM Reevaluation with update and discussion. After initial assessment and treatment, an updated evaluation reveals he is feels better at this time and has improved air movement with decreased work of breathing.  Findings discussed with the patient and all questions were answered. Mancel Bale    Medical decision making-evaluation consistent with excess patient of asthma, possibly seasonal allergy related.  Patient improved with treatment and stable for discharge.  Doubt pneumonia, PE or serious bacterial infection.  Nursing Notes Reviewed/ Care Coordinated Applicable Imaging Reviewed Interpretation of Laboratory Data incorporated into ED treatment  The patient appears reasonably screened and/or stabilized for discharge and I doubt any other medical condition or other [facility] requiring further screening, evaluation, or treatment in the ED at this time prior to discharge.  Plan: Home Medications-continue current medications, use OTC antihistamine for a couple of weeks; Home Treatments-rest, fluids; return here if the  recommended treatment, does not improve the symptoms; Recommended follow up-PCP 1 week and as needed, consider referral to pulmonology.    Final Clinical Impressions(s) / ED Diagnoses   Final diagnoses:  None    ED Discharge Orders    None       Mancel Bale, MD 08/05/17 1100

## 2017-08-05 NOTE — ED Notes (Signed)
Got patient undress on the monitor patient is resting with call bell in reach 

## 2017-08-05 NOTE — ED Notes (Signed)
States feels better.

## 2017-08-05 NOTE — Discharge Instructions (Addendum)
Use of medications as directed.  Try taking an antihistamine such as Claritin or Zyrtec daily for a couple of weeks.  See your doctor for checkup in a few days.  Consider asking for a referral to a pulmonologist for further care and treatment of your asthma.

## 2017-08-05 NOTE — ED Triage Notes (Signed)
Pt had an asthma attack at 5pm yesterday, was able to "calm it down"  Pt reports it started at dinner, smoke from dinner got in his face.  Pt able to speak in complete sentences but labored.  Breathing treatment given.

## 2017-08-12 ENCOUNTER — Ambulatory Visit: Payer: BLUE CROSS/BLUE SHIELD | Admitting: Family Medicine

## 2017-08-12 ENCOUNTER — Encounter: Payer: Self-pay | Admitting: Family Medicine

## 2017-08-12 VITALS — BP 118/86 | HR 83 | Temp 98.6°F | Resp 16 | Ht 72.0 in | Wt 222.2 lb

## 2017-08-12 DIAGNOSIS — J45998 Other asthma: Secondary | ICD-10-CM | POA: Diagnosis not present

## 2017-08-12 DIAGNOSIS — J4520 Mild intermittent asthma, uncomplicated: Secondary | ICD-10-CM

## 2017-08-12 DIAGNOSIS — E785 Hyperlipidemia, unspecified: Secondary | ICD-10-CM

## 2017-08-12 MED ORDER — FLUTICASONE PROPIONATE HFA 110 MCG/ACT IN AERO: 2.0000 | INHALATION_SPRAY | Freq: Two times a day (BID) | RESPIRATORY_TRACT | 12 refills | Status: DC

## 2017-08-12 MED ORDER — ALBUTEROL SULFATE (2.5 MG/3ML) 0.083% IN NEBU: 2.5000 mg | INHALATION_SOLUTION | Freq: Four times a day (QID) | RESPIRATORY_TRACT | 1 refills | Status: DC | PRN

## 2017-08-12 MED ORDER — ALBUTEROL SULFATE HFA 108 (90 BASE) MCG/ACT IN AERS: 2.0000 | INHALATION_SPRAY | RESPIRATORY_TRACT | 1 refills | Status: DC | PRN

## 2017-08-12 NOTE — Progress Notes (Signed)
Patient ID: William Zimmerman, male   DOB: 23-Sep-1985, 32 y.o.   MRN: [id number]

## 2017-08-12 NOTE — Progress Notes (Signed)
Patient ID: William Zimmerman, male   DOB: [birth year], 32 y.o.   MRN: [id number]     Subjective:  I acted as a Neurosurgeon for Dr. Zola Button.  Apolonio Schneiders, CMA   Patient ID: William Zimmerman, male    DOB: [birth year], 32 y.o.   MRN: [id number]  Chief Complaint  Patient presents with  . Hypertension  . Hyperlipidemia  . Asthma    HPI  Patient is in today for follow up blood pressure, cholesterol, and asthma. Patient needs nebulizer for home per Er---  Pt states he was in the [employer] and the smoke at the table triggered an asthmatic attack and the inhalers did not work so he went to the ER.    Er records reviewed.   Patient Care Team: Zola Button, Grayling Congress, DO as PCP - General (Family Medicine)   Past Medical History:  Diagnosis Date  . Asthma   . Seasonal allergies     No past surgical history on file.  Family History  Problem Relation Age of Onset  . Diabetes Mother   . Arthritis Mother   . Hypertension Father   . Diabetes Maternal Grandfather   . Arthritis Maternal Grandfather     Social History   Socioeconomic History  . Marital status: Married    Spouse name: Not on file  . Number of children: Not on file  . Years of education: Not on file  . Highest education level: Not on file  Occupational History  . Not on file  Social Needs  . Financial resource strain: Not on file  . Food insecurity:    Worry: Not on file    Inability: Not on file  . Transportation needs:    Medical: Not on file    Non-medical: Not on file  Tobacco Use  . Smoking status: Current Some Day Smoker  . Smokeless tobacco: Never Used  Substance and Sexual Activity  . Alcohol use: Yes    Alcohol/week: 2.4 oz    Types: 4 Cans of beer per week    Comment: uses on the weekends only  . Drug use: Yes    Types: Marijuana  . Sexual activity: Not on file  Lifestyle  . Physical activity:    Days per week: Not on file    Minutes per session: Not on file  . Stress: Not on file  Relationships   . Social connections:    Talks on phone: Not on file    Gets together: Not on file    Attends religious service: Not on file    Active member of club or organization: Not on file    Attends meetings of clubs or organizations: Not on file    Relationship status: Not on file  . Intimate partner violence:    Fear of current or ex partner: Not on file    Emotionally abused: Not on file    Physically abused: Not on file    Forced sexual activity: Not on file  Other Topics Concern  . Not on file  Social History Narrative  . Not on file    Outpatient Medications Prior to Visit  Medication Sig Dispense Refill  . predniSONE (DELTASONE) 20 MG tablet Take 1 tablet (20 mg total) by mouth 2 (two) times daily. 10 tablet 0  . albuterol (PROVENTIL HFA;VENTOLIN HFA) 108 (90 Base) MCG/ACT inhaler Inhale 2 puffs into the lungs every 4 (four) hours as needed for wheezing or shortness of breath. 1 Inhaler 1  . beclomethasone (  QVAR) 80 MCG/ACT inhaler Inhale 2 puffs into the lungs 2 (two) times daily. 1 Inhaler 12  . fluticasone (FLOVENT HFA) 110 MCG/ACT inhaler Inhale 2 puffs into the lungs 2 (two) times daily. 1 Inhaler 12   No facility-administered medications prior to visit.     No Known Allergies  Review of Systems  Constitutional: Negative for fever and malaise/fatigue.  HENT: Negative for congestion.   Eyes: Negative for blurred vision.  Respiratory: Negative for cough and shortness of breath.   Cardiovascular: Negative for chest pain, palpitations and leg swelling.  Gastrointestinal: Negative for vomiting.  Musculoskeletal: Negative for back pain.  Skin: Negative for rash.  Neurological: Negative for loss of consciousness and headaches.       Objective:    Physical Exam  Constitutional: He is oriented to person, place, and time. Vital signs are normal. He appears well-developed and well-nourished. He is sleeping.  HENT:  Head: Normocephalic and atraumatic.  Mouth/Throat:  Oropharynx is clear and moist.  Eyes: Pupils are equal, round, and reactive to light. EOM are normal.  Neck: Normal range of motion. Neck supple. No thyromegaly present.  Cardiovascular: Normal rate, regular rhythm and normal heart sounds.  No murmur heard. Pulmonary/Chest: Effort normal and breath sounds normal. No respiratory distress. He has no wheezes. He has no rales. He exhibits no tenderness.  Musculoskeletal: He exhibits no edema or tenderness.  Neurological: He is alert and oriented to person, place, and time.  Skin: Skin is warm and dry.  Psychiatric: He has a normal mood and affect. His behavior is normal. Judgment and thought content normal.  Nursing note and vitals reviewed.   BP 118/86 (BP Location: Right Arm, Cuff Size: Large)   Pulse 83   Temp 98.6 F (37 C) (Oral)   Resp 16   Ht 6' (1.829 m)   Wt 222 lb 3.2 oz (100.8 kg)   SpO2 95%   BMI 30.14 kg/m  Wt Readings from Last 3 Encounters:  08/12/17 222 lb 3.2 oz (100.8 kg)  05/13/17 245 lb 9.6 oz (111.4 kg)  04/22/17 251 lb 3.2 oz (113.9 kg)   BP Readings from Last 3 Encounters:  08/12/17 118/86  08/05/17 133/79  05/13/17 128/86     Immunization History  Administered Date(s) Administered  . Influenza,inj,Quad PF,6+ Mos 02/05/2014, 04/23/2016  . Tdap 02/18/2015    Health Maintenance  Topic Date Due  . HIV Screening  02/04/1999  . INFLUENZA VACCINE  10/31/2017  . TETANUS/TDAP  02/17/2025    Lab Results  Component Value Date   WBC 6.2 04/22/2017   HGB 15.3 04/22/2017   HCT 46.9 04/22/2017   PLT 297.0 04/22/2017   GLUCOSE 87 04/22/2017   CHOL 203 (H) 04/22/2017   TRIG 176.0 (H) 04/22/2017   HDL 47.00 04/22/2017   LDLCALC 121 (H) 04/22/2017   ALT 42 04/22/2017   AST 26 04/22/2017   NA 139 04/22/2017   K 4.2 04/22/2017   CL 103 04/22/2017   CREATININE 1.04 04/22/2017   BUN 15 04/22/2017   CO2 30 04/22/2017   TSH 0.60 04/23/2016    Lab Results  Component Value Date   TSH 0.60 04/23/2016    Lab Results  Component Value Date   WBC 6.2 04/22/2017   HGB 15.3 04/22/2017   HCT 46.9 04/22/2017   MCV 81.7 04/22/2017   PLT 297.0 04/22/2017   Lab Results  Component Value Date   NA 139 04/22/2017   K 4.2 04/22/2017   CO2 30 04/22/2017  GLUCOSE 87 04/22/2017   BUN 15 04/22/2017   CREATININE 1.04 04/22/2017   BILITOT 0.5 04/22/2017   ALKPHOS 65 04/22/2017   AST 26 04/22/2017   ALT 42 04/22/2017   PROT 7.0 04/22/2017   ALBUMIN 4.2 04/22/2017   CALCIUM 9.2 04/22/2017   GFR 105.64 04/22/2017   Lab Results  Component Value Date   CHOL 203 (H) 04/22/2017   Lab Results  Component Value Date   HDL 47.00 04/22/2017   Lab Results  Component Value Date   LDLCALC 121 (H) 04/22/2017   Lab Results  Component Value Date   TRIG 176.0 (H) 04/22/2017   Lab Results  Component Value Date   CHOLHDL 4 04/22/2017   No results found for: HGBA1C       Assessment & Plan:   Problem List Items Addressed This Visit      Unprioritized   Hyperlipidemia LDL goal <100 - Primary    Encouraged heart healthy diet, increase exercise, avoid trans fats, consider a krill oil cap daily      Relevant Orders   Comprehensive metabolic panel   Lipid panel   Uncontrolled persistent asthma    Neb ordered Pt doing better now con't alb and flovent  rto prn       Relevant Medications   fluticasone (FLOVENT HFA) 110 MCG/ACT inhaler   albuterol (PROVENTIL HFA;VENTOLIN HFA) 108 (90 Base) MCG/ACT inhaler   albuterol (PROVENTIL) (2.5 MG/3ML) 0.083% nebulizer solution    Other Visit Diagnoses    Mild intermittent asthma without complication       Relevant Medications   fluticasone (FLOVENT HFA) 110 MCG/ACT inhaler   albuterol (PROVENTIL HFA;VENTOLIN HFA) 108 (90 Base) MCG/ACT inhaler   albuterol (PROVENTIL) (2.5 MG/3ML) 0.083% nebulizer solution   Other Relevant Orders   DME Nebulizer machine      I have discontinued Malaquias Beranek's beclomethasone. I am also having him start on  albuterol. Additionally, I am having him maintain his predniSONE, fluticasone, and albuterol.  Meds ordered this encounter  Medications  . fluticasone (FLOVENT HFA) 110 MCG/ACT inhaler    Sig: Inhale 2 puffs into the lungs 2 (two) times daily.    Dispense:  1 Inhaler    Refill:  12  . albuterol (PROVENTIL HFA;VENTOLIN HFA) 108 (90 Base) MCG/ACT inhaler    Sig: Inhale 2 puffs into the lungs every 4 (four) hours as needed for wheezing or shortness of breath.    Dispense:  1 Inhaler    Refill:  1  . albuterol (PROVENTIL) (2.5 MG/3ML) 0.083% nebulizer solution    Sig: Take 3 mLs (2.5 mg total) by nebulization every 6 (six) hours as needed for wheezing or shortness of breath.    Dispense:  150 mL    Refill:  1    CMA served as scribe during this visit. History, Physical and Plan performed by medical provider. Documentation and orders reviewed and attested to.  Donato Schultz, DO

## 2017-08-12 NOTE — Assessment & Plan Note (Signed)
Neb ordered Pt doing better now con't alb and flovent  rto prn

## 2017-08-12 NOTE — Patient Instructions (Signed)

## 2017-08-12 NOTE — Assessment & Plan Note (Signed)
Encouraged heart healthy diet, increase exercise, avoid trans fats, consider a krill oil cap daily 

## 2017-11-06 ENCOUNTER — Other Ambulatory Visit: Payer: Self-pay | Admitting: Family Medicine

## 2017-11-06 DIAGNOSIS — J4520 Mild intermittent asthma, uncomplicated: Secondary | ICD-10-CM

## 2017-11-07 NOTE — Telephone Encounter (Signed)
Patient is calling to follow up on this prescription refill request. Informed patient of the 24-48 turn around time.

## 2017-12-17 ENCOUNTER — Other Ambulatory Visit: Payer: Self-pay | Admitting: Family Medicine

## 2017-12-17 DIAGNOSIS — J4520 Mild intermittent asthma, uncomplicated: Secondary | ICD-10-CM

## 2018-01-07 ENCOUNTER — Other Ambulatory Visit: Payer: Self-pay | Admitting: Family Medicine

## 2018-01-07 DIAGNOSIS — J4520 Mild intermittent asthma, uncomplicated: Secondary | ICD-10-CM

## 2018-02-24 ENCOUNTER — Encounter: Payer: BLUE CROSS/BLUE SHIELD | Admitting: Family Medicine

## 2018-04-08 ENCOUNTER — Other Ambulatory Visit: Payer: Self-pay | Admitting: Family Medicine

## 2018-04-08 DIAGNOSIS — J4520 Mild intermittent asthma, uncomplicated: Secondary | ICD-10-CM

## 2018-04-29 ENCOUNTER — Ambulatory Visit (INDEPENDENT_AMBULATORY_CARE_PROVIDER_SITE_OTHER): Payer: BLUE CROSS/BLUE SHIELD | Admitting: Family Medicine

## 2018-04-29 ENCOUNTER — Encounter: Payer: Self-pay | Admitting: Family Medicine

## 2018-04-29 VITALS — BP 138/80 | HR 84 | Ht 71.0 in | Wt 220.0 lb

## 2018-04-29 DIAGNOSIS — R800 Isolated proteinuria: Secondary | ICD-10-CM

## 2018-04-29 DIAGNOSIS — Z0000 Encounter for general adult medical examination without abnormal findings: Secondary | ICD-10-CM | POA: Diagnosis not present

## 2018-04-29 LAB — POC URINALSYSI DIPSTICK (AUTOMATED)
Bilirubin, UA: NEGATIVE
Blood, UA: NEGATIVE
Glucose, UA: NEGATIVE
LEUKOCYTES UA: NEGATIVE
Nitrite, UA: NEGATIVE
PH UA: 6 (ref 5.0–8.0)
PROTEIN UA: POSITIVE — AB
Spec Grav, UA: 1.025 (ref 1.010–1.025)
Urobilinogen, UA: 1 E.U./dL

## 2018-04-29 NOTE — Progress Notes (Signed)
;Patient ID: William Zimmerman, male    DOB: January 18, 1986  Age: 33 y.o. MRN: [id number]    Subjective:  Subjective  HPI William Zimmerman presents for cpe   Pt had protein in urine during DOT PE and would like that rechecked .   Review of Systems  Constitutional: Negative for appetite change, chills, diaphoresis, fatigue, fever and unexpected weight change.  HENT: Negative for congestion and hearing loss.   Eyes: Negative for pain, discharge, redness and visual disturbance.  Respiratory: Negative for cough, chest tightness, shortness of breath and wheezing.   Cardiovascular: Negative for chest pain, palpitations and leg swelling.  Gastrointestinal: Negative for abdominal pain, blood in stool, constipation, diarrhea, nausea and vomiting.  Endocrine: Negative for cold intolerance, heat intolerance, polydipsia, polyphagia and polyuria.  Genitourinary: Negative for difficulty urinating, dysuria, frequency, hematuria and urgency.  Musculoskeletal: Negative for back pain and myalgias.  Skin: Negative for rash.  Allergic/Immunologic: Negative for environmental allergies.  Neurological: Negative for dizziness, weakness, light-headedness, numbness and headaches.  Hematological: Does not bruise/bleed easily.  Psychiatric/Behavioral: Negative for suicidal ideas. The patient is not nervous/anxious.     History Past Medical History:  Diagnosis Date  . Asthma   . Seasonal allergies     He has no past surgical history on file.   His family history includes Arthritis in his maternal grandfather and mother; Diabetes in his maternal grandfather and mother; Hypertension in his father.He reports that he has been smoking. He has never used smokeless tobacco. He reports current alcohol use of about 4.0 standard drinks of alcohol per week. He reports current drug use. Drug: Marijuana.  Current Outpatient Medications on File Prior to Visit  Medication Sig Dispense Refill  . albuterol (PROVENTIL HFA;VENTOLIN HFA)  108 (90 Base) MCG/ACT inhaler INHALE 2 PUFFS INTO THE LUNGS EVERY 4 HOURS AS NEEDED FOR WHEEZING OR SHORTNESS OF BREATH 18 g 1  . albuterol (PROVENTIL) (2.5 MG/3ML) 0.083% nebulizer solution Take 3 mLs (2.5 mg total) by nebulization every 6 (six) hours as needed for wheezing or shortness of breath. 150 mL 1  . fluticasone (FLOVENT HFA) 110 MCG/ACT inhaler Inhale 2 puffs into the lungs 2 (two) times daily. 1 Inhaler 12  . predniSONE (DELTASONE) 20 MG tablet Take 1 tablet (20 mg total) by mouth 2 (two) times daily. 10 tablet 0   No current facility-administered medications on file prior to visit.      Objective:  Objective  Physical Exam Vitals signs and nursing note reviewed.  Constitutional:      General: He is not in acute distress.    Appearance: He is well-developed. He is not diaphoretic.  HENT:     Head: Normocephalic and atraumatic.     Right Ear: External ear normal.     Left Ear: External ear normal.     Nose: Nose normal.     Mouth/Throat:     Pharynx: No oropharyngeal exudate.  Eyes:     General:        Right eye: No discharge.        Left eye: No discharge.     Conjunctiva/sclera: Conjunctivae normal.     Pupils: Pupils are equal, round, and reactive to light.  Neck:     Musculoskeletal: Normal range of motion and neck supple.     Thyroid: No thyromegaly.     Vascular: No JVD.  Cardiovascular:     Rate and Rhythm: Normal rate and regular rhythm.     Heart sounds: No murmur. No friction  rub. No gallop.   Pulmonary:     Effort: Pulmonary effort is normal. No respiratory distress.     Breath sounds: Normal breath sounds. No wheezing or rales.  Chest:     Chest wall: No tenderness.  Abdominal:     General: Bowel sounds are normal. There is no distension.     Palpations: Abdomen is soft. There is no mass.     Tenderness: There is no abdominal tenderness. There is no guarding or rebound.  Genitourinary:    Penis: Normal.      Scrotum/Testes: Normal.    Musculoskeletal: Normal range of motion.        General: No tenderness.  Lymphadenopathy:     Cervical: No cervical adenopathy.  Skin:    General: Skin is warm and dry.     Coloration: Skin is not pale.     Findings: No erythema or rash.  Neurological:     Mental Status: He is alert and oriented to person, place, and time.     Motor: No abnormal muscle tone.     Deep Tendon Reflexes: Reflexes are normal and symmetric. Reflexes normal.  Psychiatric:        Behavior: Behavior normal.        Thought Content: Thought content normal.        Judgment: Judgment normal.    BP 138/80   Pulse 84   Ht 5\' 11"  (1.803 m)   Wt 220 lb (99.8 kg)   SpO2 97%   BMI 30.68 kg/m  Wt Readings from Last 3 Encounters:  04/29/18 220 lb (99.8 kg)  08/12/17 222 lb 3.2 oz (100.8 kg)  05/13/17 245 lb 9.6 oz (111.4 kg)     Lab Results  Component Value Date   WBC 6.8 04/29/2018   HGB 15.7 04/29/2018   HCT 47.5 04/29/2018   PLT 266.0 04/29/2018   GLUCOSE 100 (H) 04/29/2018   CHOL 209 (H) 04/29/2018   TRIG 147.0 04/29/2018   HDL 63.40 04/29/2018   LDLCALC 116 (H) 04/29/2018   ALT 26 04/29/2018   AST 23 04/29/2018   NA 140 04/29/2018   K 4.0 04/29/2018   CL 105 04/29/2018   CREATININE 1.21 04/29/2018   BUN 16 04/29/2018   CO2 28 04/29/2018   TSH 1.36 04/29/2018   MICROALBUR 7.4 (H) 04/29/2018    No results found.   Assessment & Plan:  Plan  I am having Teodoro SprayAdrian Head maintain his predniSONE, fluticasone, albuterol, and albuterol.  No orders of the defined types were placed in this encounter.   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  Donato SchultzYvonne R Lowne Chase, DO

## 2018-04-29 NOTE — Patient Instructions (Signed)
Preventive Care 18-39 Years, Male Preventive care refers to lifestyle choices and visits with your health care provider that can promote health and wellness. What does preventive care include?   A yearly physical exam. This is also called an annual well check.  Dental exams once or twice a year.  Routine eye exams. Ask your health care provider how often you should have your eyes checked.  Personal lifestyle choices, including: ? Daily care of your teeth and gums. ? Regular physical activity. ? Eating a healthy diet. ? Avoiding tobacco and drug use. ? Limiting alcohol use. ? Practicing safe sex. What happens during an annual well check? The services and screenings done by your health care provider during your annual well check will depend on your age, overall health, lifestyle risk factors, and family history of disease. Counseling Your health care provider may ask you questions about your:  Alcohol use.  Tobacco use.  Drug use.  Emotional well-being.  Home and relationship well-being.  Sexual activity.  Eating habits.  Work and work environment. Screening You may have the following tests or measurements:  Height, weight, and BMI.  Blood pressure.  Lipid and cholesterol levels. These may be checked every 5 years starting at age 20.  Diabetes screening. This is done by checking your blood sugar (glucose) after you have not eaten for a while (fasting).  Skin check.  Hepatitis C blood test.  Hepatitis B blood test.  Sexually transmitted disease (STD) testing. Discuss your test results, treatment options, and if necessary, the need for more tests with your health care provider. Vaccines Your health care provider may recommend certain vaccines, such as:  Influenza vaccine. This is recommended every year.  Tetanus, diphtheria, and acellular pertussis (Tdap, Td) vaccine. You may need a Td booster every 10 years.  Varicella vaccine. You may need this if you  have not been vaccinated.  HPV vaccine. If you are 26 or younger, you may need three doses over 6 months.  Measles, mumps, and rubella (MMR) vaccine. You may need at least one dose of MMR.You may also need a second dose.  Pneumococcal 13-valent conjugate (PCV13) vaccine. You may need this if you have certain conditions and have not been vaccinated.  Pneumococcal polysaccharide (PPSV23) vaccine. You may need one or two doses if you smoke cigarettes or if you have certain conditions.  Meningococcal vaccine. One dose is recommended if you are age 19-21 years and a first-year college student living in a residence hall, or if you have one of several medical conditions. You may also need additional booster doses.  Hepatitis A vaccine. You may need this if you have certain conditions or if you travel or work in places where you may be exposed to hepatitis A.  Hepatitis B vaccine. You may need this if you have certain conditions or if you travel or work in places where you may be exposed to hepatitis B.  Haemophilus influenzae type b (Hib) vaccine. You may need this if you have certain risk factors. Talk to your health care provider about which screenings and vaccines you need and how often you need them. This information is not intended to replace advice given to you by your health care provider. Make sure you discuss any questions you have with your health care provider. Document Released: 05/15/2001 Document Revised: 10/30/2016 Document Reviewed: 01/18/2015 Elsevier Interactive Patient Education  2019 Elsevier Inc.  

## 2018-04-30 DIAGNOSIS — R800 Isolated proteinuria: Secondary | ICD-10-CM | POA: Insufficient documentation

## 2018-04-30 LAB — CBC WITH DIFFERENTIAL/PLATELET
BASOS ABS: 0.1 10*3/uL (ref 0.0–0.1)
Basophils Relative: 1.3 % (ref 0.0–3.0)
EOS ABS: 0.1 10*3/uL (ref 0.0–0.7)
Eosinophils Relative: 2 % (ref 0.0–5.0)
HCT: 47.5 % (ref 39.0–52.0)
HEMOGLOBIN: 15.7 g/dL (ref 13.0–17.0)
Lymphocytes Relative: 36.5 % (ref 12.0–46.0)
Lymphs Abs: 2.5 10*3/uL (ref 0.7–4.0)
MCHC: 33 g/dL (ref 30.0–36.0)
MCV: 84 fl (ref 78.0–100.0)
MONO ABS: 0.5 10*3/uL (ref 0.1–1.0)
Monocytes Relative: 7.4 % (ref 3.0–12.0)
Neutro Abs: 3.6 10*3/uL (ref 1.4–7.7)
Neutrophils Relative %: 52.8 % (ref 43.0–77.0)
Platelets: 266 10*3/uL (ref 150.0–400.0)
RBC: 5.65 Mil/uL (ref 4.22–5.81)
RDW: 14.7 % (ref 11.5–15.5)
WBC: 6.8 10*3/uL (ref 4.0–10.5)

## 2018-04-30 LAB — COMPREHENSIVE METABOLIC PANEL
ALBUMIN: 4.6 g/dL (ref 3.5–5.2)
ALK PHOS: 66 U/L (ref 39–117)
ALT: 26 U/L (ref 0–53)
AST: 23 U/L (ref 0–37)
BUN: 16 mg/dL (ref 6–23)
CO2: 28 mEq/L (ref 19–32)
Calcium: 9.7 mg/dL (ref 8.4–10.5)
Chloride: 105 mEq/L (ref 96–112)
Creatinine, Ser: 1.21 mg/dL (ref 0.40–1.50)
GFR: 82.95 mL/min (ref >60.00)
Glucose, Bld: 100 mg/dL — ABNORMAL HIGH (ref 70–99)
Potassium: 4 mEq/L (ref 3.5–5.1)
Sodium: 140 mEq/L (ref 135–145)
TOTAL PROTEIN: 6.9 g/dL (ref 6.0–8.3)
Total Bilirubin: 0.5 mg/dL (ref 0.2–1.2)

## 2018-04-30 LAB — MICROALBUMIN / CREATININE URINE RATIO
Creatinine,U: 274.9 mg/dL
MICROALB UR: 7.4 mg/dL — AB (ref 0.0–1.9)
Microalb Creat Ratio: 2.7 mg/g (ref 0.0–30.0)

## 2018-04-30 LAB — LIPID PANEL
CHOLESTEROL: 209 mg/dL — AB (ref 0–200)
HDL: 63.4 mg/dL (ref >39.00)
LDL CALC: 116 mg/dL — AB (ref 0–99)
NonHDL: 145.2
TRIGLYCERIDES: 147 mg/dL (ref 0.0–149.0)
Total CHOL/HDL Ratio: 3
VLDL: 29.4 mg/dL (ref 0.0–40.0)

## 2018-04-30 LAB — TSH: TSH: 1.36 u[IU]/mL (ref 0.35–4.50)

## 2018-04-30 NOTE — Assessment & Plan Note (Addendum)
ghm utd Check labs  See avs  

## 2018-04-30 NOTE — Assessment & Plan Note (Signed)
Recheck today Check microalbumin Consider nephrology referral

## 2018-05-02 ENCOUNTER — Other Ambulatory Visit: Payer: Self-pay | Admitting: Family Medicine

## 2018-05-02 DIAGNOSIS — R809 Proteinuria, unspecified: Secondary | ICD-10-CM

## 2018-05-09 ENCOUNTER — Other Ambulatory Visit: Payer: Self-pay | Admitting: *Deleted

## 2018-05-09 DIAGNOSIS — J4520 Mild intermittent asthma, uncomplicated: Secondary | ICD-10-CM

## 2018-05-09 MED ORDER — ALBUTEROL SULFATE HFA 108 (90 BASE) MCG/ACT IN AERS: INHALATION_SPRAY | RESPIRATORY_TRACT | 1 refills | Status: DC

## 2018-07-07 ENCOUNTER — Encounter: Payer: Self-pay | Admitting: Family Medicine

## 2018-07-07 ENCOUNTER — Ambulatory Visit (INDEPENDENT_AMBULATORY_CARE_PROVIDER_SITE_OTHER): Payer: BLUE CROSS/BLUE SHIELD | Admitting: Family Medicine

## 2018-07-07 ENCOUNTER — Other Ambulatory Visit: Payer: Self-pay

## 2018-07-07 DIAGNOSIS — J4520 Mild intermittent asthma, uncomplicated: Secondary | ICD-10-CM | POA: Diagnosis not present

## 2018-07-07 DIAGNOSIS — R800 Isolated proteinuria: Secondary | ICD-10-CM | POA: Diagnosis not present

## 2018-07-07 MED ORDER — ALBUTEROL SULFATE HFA 108 (90 BASE) MCG/ACT IN AERS: INHALATION_SPRAY | RESPIRATORY_TRACT | 1 refills | Status: DC

## 2018-07-07 MED ORDER — ALBUTEROL SULFATE (2.5 MG/3ML) 0.083% IN NEBU: 2.5000 mg | INHALATION_SOLUTION | Freq: Four times a day (QID) | RESPIRATORY_TRACT | 1 refills | Status: DC | PRN

## 2018-07-07 MED ORDER — FLUTICASONE PROPIONATE HFA 110 MCG/ACT IN AERO: 2.0000 | INHALATION_SPRAY | Freq: Two times a day (BID) | RESPIRATORY_TRACT | 12 refills | Status: DC

## 2018-07-07 NOTE — Assessment & Plan Note (Signed)
Controlled with qvar and albuterol

## 2018-07-07 NOTE — Assessment & Plan Note (Signed)
Repeat labs and urine

## 2018-07-07 NOTE — Progress Notes (Signed)
Virtual Visit via Video Note  I connected with Teodoro Spray on 07/07/18 at  9:45 AM EDT by a video enabled telemedicine application and verified that I am speaking with the correct person using two identifiers.   I discussed the limitations of evaluation and management by telemedicine and the availability of in person appointments. The patient expressed understanding and agreed to proceed.  History of Present Illness: Pt at home wanting to discuss labs from January.  He had protein in his urine and microalbumin was elevated----  We discussed this and the possibility of kidney problems and a referral to nephrology.   He also needs refills on his inhalers-- he has not needed to use the albuterol often  provider--working from home  Observations/Objective: Unable to do vs due to corona virus and vita being virtual   Assessment and Plan: 1. Mild intermittent asthma without complication Stable Refill inhalers  - fluticasone (FLOVENT HFA) 110 MCG/ACT inhaler; Inhale 2 puffs into the lungs 2 (two) times daily.  Dispense: 1 Inhaler; Refill: 12 - albuterol (PROVENTIL) (2.5 MG/3ML) 0.083% nebulizer solution; Take 3 mLs (2.5 mg total) by nebulization every 6 (six) hours as needed for wheezing or shortness of breath.  Dispense: 150 mL; Refill: 1 - albuterol (PROVENTIL HFA;VENTOLIN HFA) 108 (90 Base) MCG/ACT inhaler; INHALE 2 PUFFS INTO THE LUNGS EVERY 4 HOURS AS NEEDED FOR WHEEZING OR SHORTNESS OF BREATH  Dispense: 18 g; Refill: 1  2. 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 The patient was provided an opportunity to ask questions and all were answered. The patient agreed with the plan and demonstrated an understanding of the instructions.   The patient was advised to call back or seek an in-person evaluation if the symptoms worsen or if the condition fails to improve as  anticipated.     Donato Schultz, DO

## 2018-07-14 ENCOUNTER — Other Ambulatory Visit: Payer: Self-pay | Admitting: Family Medicine

## 2018-07-14 DIAGNOSIS — J4520 Mild intermittent asthma, uncomplicated: Secondary | ICD-10-CM

## 2018-07-21 ENCOUNTER — Ambulatory Visit: Payer: BLUE CROSS/BLUE SHIELD | Admitting: Family Medicine

## 2018-10-29 ENCOUNTER — Other Ambulatory Visit: Payer: Self-pay | Admitting: Family Medicine

## 2018-10-29 DIAGNOSIS — J4520 Mild intermittent asthma, uncomplicated: Secondary | ICD-10-CM

## 2018-12-09 ENCOUNTER — Other Ambulatory Visit: Payer: Self-pay | Admitting: Family Medicine

## 2018-12-09 DIAGNOSIS — J4520 Mild intermittent asthma, uncomplicated: Secondary | ICD-10-CM

## 2018-12-10 ENCOUNTER — Ambulatory Visit: Payer: BLUE CROSS/BLUE SHIELD | Admitting: Family Medicine

## 2018-12-10 ENCOUNTER — Other Ambulatory Visit: Payer: Self-pay

## 2018-12-11 ENCOUNTER — Encounter: Payer: Self-pay | Admitting: Family Medicine

## 2018-12-11 ENCOUNTER — Ambulatory Visit (INDEPENDENT_AMBULATORY_CARE_PROVIDER_SITE_OTHER): Payer: BC Managed Care – PPO | Admitting: Family Medicine

## 2018-12-11 VITALS — BP 120/85 | HR 85 | Temp 97.9°F | Resp 18 | Ht 71.0 in | Wt 227.8 lb

## 2018-12-11 DIAGNOSIS — R0683 Snoring: Secondary | ICD-10-CM

## 2018-12-11 NOTE — Patient Instructions (Signed)
Sleep Apnea Sleep apnea is a condition in which breathing pauses or becomes shallow during sleep. Episodes of sleep apnea usually last 10 seconds or longer, and they may occur as many as 20 times an hour. Sleep apnea disrupts your sleep and keeps your body from getting the rest that it needs. This condition can increase your risk of certain health problems, including:  Heart attack.  Stroke.  Obesity.  Diabetes.  Heart failure.  Irregular heartbeat. What are the causes? There are three kinds of sleep apnea:  Obstructive sleep apnea. This kind is caused by a blocked or collapsed airway.  Central sleep apnea. This kind happens when the part of the brain that controls breathing does not send the correct signals to the muscles that control breathing.  Mixed sleep apnea. This is a combination of obstructive and central sleep apnea. The most common cause of this condition is a collapsed or blocked airway. An airway can collapse or become blocked if:  Your throat muscles are abnormally relaxed.  Your tongue and tonsils are larger than normal.  You are overweight.  Your airway is smaller than normal. What increases the risk? You are more likely to develop this condition if you:  Are overweight.  Smoke.  Have a smaller than normal airway.  Are elderly.  Are male.  Drink alcohol.  Take sedatives or tranquilizers.  Have a family history of sleep apnea. What are the signs or symptoms? Symptoms of this condition include:  Trouble staying asleep.  Daytime sleepiness and tiredness.  Irritability.  Loud snoring.  Morning headaches.  Trouble concentrating.  Forgetfulness.  Decreased interest in sex.  Unexplained sleepiness.  Mood swings.  Personality changes.  Feelings of depression.  Waking up often during the night to urinate.  Dry mouth.  Sore throat. How is this diagnosed? This condition may be diagnosed with:  A medical history.  A physical  exam.  A series of tests that are done while you are sleeping (sleep study). These tests are usually done in a sleep lab, but they may also be done at home. How is this treated? Treatment for this condition aims to restore normal breathing and to ease symptoms during sleep. It may involve managing health issues that can affect breathing, such as high blood pressure or obesity. Treatment may include:  Sleeping on your side.  Using a decongestant if you have nasal congestion.  Avoiding the use of depressants, including alcohol, sedatives, and narcotics.  Losing weight if you are overweight.  Making changes to your diet.  Quitting smoking.  Using a device to open your airway while you sleep, such as: ? An oral appliance. This is a custom-made mouthpiece that shifts your lower jaw forward. ? A continuous positive airway pressure (CPAP) device. This device blows air through a mask when you breathe out (exhale). ? A nasal expiratory positive airway pressure (EPAP) device. This device has valves that you put into each nostril. ? A bi-level positive airway pressure (BPAP) device. This device blows air through a mask when you breathe in (inhale) and breathe out (exhale).  Having surgery if other treatments do not work. During surgery, excess tissue is removed to create a wider airway. It is important to get treatment for sleep apnea. Without treatment, this condition can lead to:  High blood pressure.  Coronary artery disease.  In men, an inability to achieve or maintain an erection (impotence).  Reduced thinking abilities. Follow these instructions at home: Lifestyle  Make any lifestyle changes   that your health care provider recommends.  Eat a healthy, well-balanced diet.  Take steps to lose weight if you are overweight.  Avoid using depressants, including alcohol, sedatives, and narcotics.  Do not use any products that contain nicotine or tobacco, such as cigarettes,  e-cigarettes, and chewing tobacco. If you need help quitting, ask your health care provider. General instructions  Take over-the-counter and prescription medicines only as told by your health care provider.  If you were given a device to open your airway while you sleep, use it only as told by your health care provider.  If you are having surgery, make sure to tell your health care provider you have sleep apnea. You may need to bring your device with you.  Keep all follow-up visits as told by your health care provider. This is important. Contact a health care provider if:  The device that you received to open your airway during sleep is uncomfortable or does not seem to be working.  Your symptoms do not improve.  Your symptoms get worse. Get help right away if:  You develop: ? Chest pain. ? Shortness of breath. ? Discomfort in your back, arms, or stomach.  You have: ? Trouble speaking. ? Weakness on one side of your body. ? Drooping in your face. These symptoms may represent a serious problem that is an emergency. Do not wait to see if the symptoms will go away. Get medical help right away. Call your local emergency services (911 in the [place] Do not drive yourself to the hospital. Summary  Sleep apnea is a condition in which breathing pauses or becomes shallow during sleep.  The most common cause is a collapsed or blocked airway.  The goal of treatment is to restore normal breathing and to ease symptoms during sleep. This information is not intended to replace advice given to you by your health care provider. Make sure you discuss any questions you have with your health care provider. Document Released: 03/09/2002 Document Revised: 01/03/2018 Document Reviewed: 11/12/2017 Elsevier Patient Education  2020 Elsevier Inc.  
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
## 2018-12-18 ENCOUNTER — Encounter: Payer: Self-pay | Admitting: Pulmonary Disease

## 2018-12-18 ENCOUNTER — Ambulatory Visit (INDEPENDENT_AMBULATORY_CARE_PROVIDER_SITE_OTHER): Payer: BC Managed Care – PPO | Admitting: Pulmonary Disease

## 2018-12-18 ENCOUNTER — Other Ambulatory Visit: Payer: Self-pay

## 2018-12-18 VITALS — BP 134/80 | HR 77 | Temp 97.4°F | Ht 71.0 in | Wt 227.0 lb

## 2018-12-18 DIAGNOSIS — R0683 Snoring: Secondary | ICD-10-CM
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
## 2018-12-25 ENCOUNTER — Telehealth: Payer: Self-pay | Admitting: Pulmonary Disease

## 2018-12-25 NOTE — Telephone Encounter (Signed)
I have spoken with William Zimmerman and he is scheduled to pick up HST machine on 12/30/2018 @ 1:30pm

## 2018-12-30 ENCOUNTER — Ambulatory Visit: Payer: BC Managed Care – PPO

## 2018-12-30 ENCOUNTER — Other Ambulatory Visit: Payer: Self-pay

## 2018-12-30 DIAGNOSIS — R0683 Snoring: Secondary | ICD-10-CM

## 2018-12-31 ENCOUNTER — Telehealth: Payer: Self-pay

## 2018-12-31 ENCOUNTER — Telehealth: Payer: Self-pay | Admitting: Pulmonary Disease

## 2018-12-31 DIAGNOSIS — G4733 Obstructive sleep apnea (adult) (pediatric): Secondary | ICD-10-CM

## 2018-12-31 NOTE — Telephone Encounter (Signed)
Call made to patient, confirmed DOB.   Made aware of results per OA:  Recommend CPAP therapy for mild obstructive sleep apnea.   Auto titrating CPAP with pressure settings of 5-15 will be appropriate.   Close clinical F/U with compliance monitoring to optimize therapeutic efficiency.   Weight loss encouraged.   Caution against driving when sleepy and against medication with sedative effects.   Voiced understanding. Okay to place order for cpap per patient. Order placed. Nothing further needed at this time.

## 2018-12-31 NOTE — Telephone Encounter (Signed)
Copy of sleep study given. Nothing further needed at this time.

## 2019-01-01 DIAGNOSIS — G4733 Obstructive sleep apnea (adult) (pediatric): Secondary | ICD-10-CM

## 2019-01-05 ENCOUNTER — Telehealth: Payer: Self-pay | Admitting: Family Medicine

## 2019-01-05 NOTE — Telephone Encounter (Signed)
Pt called back to provide dentist fax# [phone]

## 2019-01-05 NOTE — Telephone Encounter (Signed)
Have you seen this form?

## 2019-01-05 NOTE — Telephone Encounter (Signed)
Spoke with patient.  He faxed over bp readings from dentist 152/107, 142/105, 184/123  Patient was seen last on 12/11/18 at that bp was 120/85 and he is not on any bp medications.  Patient stated that he was up all night drinking and did not get in until 6am and his dental appt was at 10am.   He stated that bp was checked automatic and manually.  He did not get any dental work done and they need clearance formed filled out and faxed back.  Per Dr. Etter Sjogren she would like to see patient.  appt made for 01/06/19.  Clearance form given to [facility].

## 2019-01-05 NOTE — Telephone Encounter (Signed)
Patient was receiving a filling today and Dentist is requesting a surgical clearance form due to high BP, form being faxed to [phone], please note when received. Patient will call back and provide Dentist fax #

## 2019-01-06 ENCOUNTER — Ambulatory Visit: Payer: BC Managed Care – PPO | Admitting: Family Medicine

## 2019-01-06 ENCOUNTER — Other Ambulatory Visit: Payer: Self-pay

## 2019-01-06 ENCOUNTER — Encounter: Payer: Self-pay | Admitting: Family Medicine

## 2019-01-06 VITALS — BP 124/78 | HR 115 | Temp 97.5°F | Resp 18 | Ht 71.0 in | Wt 230.4 lb

## 2019-01-06 DIAGNOSIS — R030 Elevated blood-pressure reading, without diagnosis of hypertension: Secondary | ICD-10-CM | POA: Diagnosis not present

## 2019-01-06 NOTE — Patient Instructions (Signed)

## 2019-01-06 NOTE — Progress Notes (Signed)
Patient ID: William Zimmerman, male    DOB: 07-25-1985  Age: 33 y.o. MRN: [id number]    Subjective:  Subjective goin HPI William Zimmerman presents for f/u bp.   He was at the dentist last week and his bp was very high ---- they were going to do filling and bridge but could not due to his bp.  Pt had been partying the night before and was drinking --- never slept.  He did not tell the dentist this   He has no complaints  Review of Systems  Constitutional: Negative for appetite change, diaphoresis, fatigue and unexpected weight change.  Eyes: Negative for pain, redness and visual disturbance.  Respiratory: Negative for cough, chest tightness, shortness of breath and wheezing.   Cardiovascular: Negative for chest pain, palpitations and leg swelling.  Endocrine: Negative for cold intolerance, heat intolerance, polydipsia, polyphagia and polyuria.  Genitourinary: Negative for difficulty urinating, dysuria and frequency.  Neurological: Negative for dizziness, light-headedness, numbness and headaches.    History Past Medical History:  Diagnosis Date  . Asthma   . Seasonal allergies     He has no past surgical history on file.   His family history includes Arthritis in his maternal grandfather and mother; Diabetes in his maternal grandfather and mother; Hypertension in his father.He reports that he has been smoking. He has never used smokeless tobacco. He reports current alcohol use of about 4.0 standard drinks of alcohol per week. He reports current drug use. Drug: Marijuana.  Current Outpatient Medications on File Prior to Visit  Medication Sig Dispense Refill  . albuterol (PROVENTIL) (2.5 MG/3ML) 0.083% nebulizer solution Take 3 mLs (2.5 mg total) by nebulization every 6 (six) hours as needed for wheezing or shortness of breath. 150 mL 1  . albuterol (VENTOLIN HFA) 108 (90 Base) MCG/ACT inhaler INHALE 2 PUFFS INTO THE LUNGS EVERY 4 HOURS AS NEEDED FOR WHEEZING OR SHORTNESS OF BREATH 8.5 g 1  .  fluticasone (FLOVENT HFA) 110 MCG/ACT inhaler Inhale 2 puffs into the lungs 2 (two) times daily. 1 Inhaler 12   No current facility-administered medications on file prior to visit.      Objective:  Objective  Physical Exam Vitals signs and nursing note reviewed.  Constitutional:      General: He is sleeping. He is not in acute distress.    Appearance: He is well-developed.  HENT:     Head: Normocephalic and atraumatic.  Eyes:     Pupils: Pupils are equal, round, and reactive to light.  Neck:     Musculoskeletal: Normal range of motion and neck supple.     Thyroid: No thyromegaly.  Cardiovascular:     Rate and Rhythm: Normal rate and regular rhythm.     Heart sounds: Normal heart sounds. No murmur.  Pulmonary:     Effort: Pulmonary effort is normal. No respiratory distress.     Breath sounds: Normal breath sounds. No wheezing or rales.  Chest:     Chest wall: No tenderness.  Musculoskeletal:        General: No tenderness.  Skin:    General: Skin is warm and dry.  Neurological:     Mental Status: He is oriented to person, place, and time.  Psychiatric:        Behavior: Behavior normal.        Thought Content: Thought content normal.        Judgment: Judgment normal.    BP 124/78 (BP Location: Right Arm, Patient Position: Sitting, Cuff Size: Large)  Pulse (!) 115   Temp (!) 97.5 F (36.4 C) (Temporal)   Resp 18   Ht 5\' 11"  (1.803 m)   Wt 230 lb 6.4 oz (104.5 kg)   SpO2 97%   BMI 32.13 kg/m  Wt Readings from Last 3 Encounters:  01/06/19 230 lb 6.4 oz (104.5 kg)  12/18/18 227 lb (103 kg)  12/11/18 227 lb 12.8 oz (103.3 kg)     Lab Results  Component Value Date   WBC 6.8 04/29/2018   HGB 15.7 04/29/2018   HCT 47.5 04/29/2018   PLT 266.0 04/29/2018   GLUCOSE 100 (H) 04/29/2018   CHOL 209 (H) 04/29/2018   TRIG 147.0 04/29/2018   HDL 63.40 04/29/2018   LDLCALC 116 (H) 04/29/2018   ALT 26 04/29/2018   AST 23 04/29/2018   NA 140 04/29/2018   K 4.0  04/29/2018   CL 105 04/29/2018   CREATININE 1.21 04/29/2018   BUN 16 04/29/2018   CO2 28 04/29/2018   TSH 1.36 04/29/2018   MICROALBUR 7.4 (H) 04/29/2018    No results found.   Assessment & Plan:  Plan  I am having 05/01/2018 maintain his fluticasone, albuterol, and albuterol.  No orders of the defined types were placed in this encounter.   Problem List Items Addressed This Visit      Unprioritized   Elevated BP without diagnosis of hypertension - Primary    bp normal  Forms signed and faxed back to dentist          Follow-up: Return if symptoms worsen or fail to improve.  Teodoro Spray, DO

## 2019-01-07 NOTE — Assessment & Plan Note (Signed)
bp normal  Forms signed and faxed back to dentist

## 2019-01-08 ENCOUNTER — Telehealth: Payer: Self-pay | Admitting: Pulmonary Disease

## 2019-01-08 NOTE — Telephone Encounter (Signed)
It is after 5pm, [facility] is closed. Will hold in triage and call in the AM.

## 2019-01-09 NOTE — Telephone Encounter (Signed)
Spoke with pt, and advised him that [facility] is awaiting a return call to schedule an appt for him to receive his CPAP. I called over to [employer] and spoke with a rep and she stated that's all he needed to do. Pt understood and nothing further is needed.

## 2019-01-20 ENCOUNTER — Other Ambulatory Visit: Payer: Self-pay | Admitting: *Deleted

## 2019-01-20 DIAGNOSIS — J4520 Mild intermittent asthma, uncomplicated: Secondary | ICD-10-CM

## 2019-01-20 MED ORDER — ALBUTEROL SULFATE HFA 108 (90 BASE) MCG/ACT IN AERS: 2.0000 | INHALATION_SPRAY | RESPIRATORY_TRACT | 1 refills | Status: DC | PRN

## 2019-03-03 ENCOUNTER — Other Ambulatory Visit: Payer: Self-pay | Admitting: Family Medicine

## 2019-03-03 DIAGNOSIS — J4520 Mild intermittent asthma, uncomplicated: Secondary | ICD-10-CM

## 2019-04-15 ENCOUNTER — Telehealth: Payer: Self-pay | Admitting: Family Medicine

## 2019-04-15 DIAGNOSIS — J4520 Mild intermittent asthma, uncomplicated: Secondary | ICD-10-CM

## 2019-04-15 MED ORDER — ALBUTEROL SULFATE HFA 108 (90 BASE) MCG/ACT IN AERS: INHALATION_SPRAY | RESPIRATORY_TRACT | 1 refills | Status: DC

## 2019-04-15 NOTE — Telephone Encounter (Signed)
Pt is requesting a refill for albuterol (VENTOLIN HFA) 108 (90 Base) MCG/ACT inhaler    Pharmacy:  [id number] [place], [place] - [address] AT [address] Phone:  [phone]  Fax:  [phone]

## 2019-04-15 NOTE — Telephone Encounter (Signed)
Refill sent.

## 2019-05-01 ENCOUNTER — Other Ambulatory Visit: Payer: Self-pay

## 2019-05-04 ENCOUNTER — Ambulatory Visit (INDEPENDENT_AMBULATORY_CARE_PROVIDER_SITE_OTHER): Payer: BC Managed Care – PPO | Admitting: Family Medicine

## 2019-05-04 ENCOUNTER — Other Ambulatory Visit: Payer: Self-pay

## 2019-05-04 ENCOUNTER — Encounter: Payer: Self-pay | Admitting: Family Medicine

## 2019-05-04 DIAGNOSIS — J4520 Mild intermittent asthma, uncomplicated: Secondary | ICD-10-CM

## 2019-05-04 DIAGNOSIS — Z0000 Encounter for general adult medical examination without abnormal findings: Secondary | ICD-10-CM | POA: Diagnosis not present

## 2019-05-04 DIAGNOSIS — J45998 Other asthma: Secondary | ICD-10-CM | POA: Diagnosis not present

## 2019-05-04 LAB — COMPREHENSIVE METABOLIC PANEL
ALT: 35 U/L (ref 0–53)
AST: 26 U/L (ref 0–37)
Albumin: 4.4 g/dL (ref 3.5–5.2)
Alkaline Phosphatase: 53 U/L (ref 39–117)
BUN: 17 mg/dL (ref 6–23)
CO2: 29 mEq/L (ref 19–32)
Calcium: 9.2 mg/dL (ref 8.4–10.5)
Chloride: 103 mEq/L (ref 96–112)
Creatinine, Ser: 1.14 mg/dL (ref 0.40–1.50)
GFR: 88.33 mL/min (ref >60.00)
Glucose, Bld: 94 mg/dL (ref 70–99)
Potassium: 4.3 mEq/L (ref 3.5–5.1)
Sodium: 141 mEq/L (ref 135–145)
Total Bilirubin: 0.5 mg/dL (ref 0.2–1.2)
Total Protein: 7 g/dL (ref 6.0–8.3)

## 2019-05-04 LAB — CBC WITH DIFFERENTIAL/PLATELET
Basophils Absolute: 0 10*3/uL (ref 0.0–0.1)
Basophils Relative: 0.5 % (ref 0.0–3.0)
Eosinophils Absolute: 0.2 10*3/uL (ref 0.0–0.7)
Eosinophils Relative: 3.8 % (ref 0.0–5.0)
HCT: 46.2 % (ref 39.0–52.0)
Hemoglobin: 15.3 g/dL (ref 13.0–17.0)
Lymphocytes Relative: 35.3 % (ref 12.0–46.0)
Lymphs Abs: 1.7 10*3/uL (ref 0.7–4.0)
MCHC: 33 g/dL (ref 30.0–36.0)
MCV: 83.9 fl (ref 78.0–100.0)
Monocytes Absolute: 0.3 10*3/uL (ref 0.1–1.0)
Monocytes Relative: 7.1 % (ref 3.0–12.0)
Neutro Abs: 2.6 10*3/uL (ref 1.4–7.7)
Neutrophils Relative %: 53.3 % (ref 43.0–77.0)
Platelets: 306 10*3/uL (ref 150.0–400.0)
RBC: 5.51 Mil/uL (ref 4.22–5.81)
RDW: 14.2 % (ref 11.5–15.5)
WBC: 4.8 10*3/uL (ref 4.0–10.5)

## 2019-05-04 LAB — LIPID PANEL
Cholesterol: 204 mg/dL — ABNORMAL HIGH (ref 0–200)
HDL: 56.2 mg/dL (ref >39.00)
LDL Cholesterol: 115 mg/dL — ABNORMAL HIGH (ref 0–99)
NonHDL: 147.91
Total CHOL/HDL Ratio: 4
Triglycerides: 166 mg/dL — ABNORMAL HIGH (ref 0.0–149.0)
VLDL: 33.2 mg/dL (ref 0.0–40.0)

## 2019-05-04 LAB — TSH: TSH: 0.62 u[IU]/mL (ref 0.35–4.50)

## 2019-05-04 MED ORDER — ALBUTEROL SULFATE HFA 108 (90 BASE) MCG/ACT IN AERS: INHALATION_SPRAY | RESPIRATORY_TRACT | 1 refills | Status: DC

## 2019-05-04 NOTE — Assessment & Plan Note (Signed)
ghm utd Check labs See AVS 

## 2019-05-04 NOTE — Assessment & Plan Note (Signed)
D/w with pt importance of using flovent bid and only using albuterol as needed Consider pulmonary referral if needed

## 2019-05-04 NOTE — Patient Instructions (Signed)
COVID-19 Vaccine Information can be found at: [email or web address] For questions related to vaccine distribution or appointments, please email [email or web address] or call [phone].     Preventive Care 21-36 Years Old, Male Preventive care refers to lifestyle choices and visits with your health care provider that can promote health and wellness. This includes:  A yearly physical exam. This is also called an annual well check.  Regular dental and eye exams.  Immunizations.  Screening for certain conditions.  Healthy lifestyle choices, such as eating a healthy diet, getting regular exercise, not using drugs or products that contain nicotine and tobacco, and limiting alcohol use. What can I expect for my preventive care visit? Physical exam Your health care provider will check:  Height and weight. These may be used to calculate body mass index (BMI), which is a measurement that tells if you are at a healthy weight.  Heart rate and blood pressure.  Your skin for abnormal spots. Counseling Your health care provider may ask you questions about:  Alcohol, tobacco, and drug use.  Emotional well-being.  Home and relationship well-being.  Sexual activity.  Eating habits.  Work and work environment. What immunizations do I need?  Influenza (flu) vaccine  This is recommended every year. Tetanus, diphtheria, and pertussis (Tdap) vaccine  You may need a Td booster every 10 years. Varicella (chickenpox) vaccine  You may need this vaccine if you have not already been vaccinated. Human papillomavirus (HPV) vaccine  If recommended by your health care provider, you may need three doses over 6 months. Measles, mumps, and rubella (MMR) vaccine  You may need at least one dose of MMR. You may also need a second dose. Meningococcal conjugate (MenACWY) vaccine  One dose is recommended if you are 19-21 years old and a  first-year college student living in a residence hall, or if you have one of several medical conditions. You may also need additional booster doses. Pneumococcal conjugate (PCV13) vaccine  You may need this if you have certain conditions and were not previously vaccinated. Pneumococcal polysaccharide (PPSV23) vaccine  You may need one or two doses if you smoke cigarettes or if you have certain conditions. Hepatitis A vaccine  You may need this if you have certain conditions or if you travel or work in places where you may be exposed to hepatitis A. Hepatitis B vaccine  You may need this if you have certain conditions or if you travel or work in places where you may be exposed to hepatitis B. Haemophilus influenzae type b (Hib) vaccine  You may need this if you have certain risk factors. You may receive vaccines as individual doses or as more than one vaccine together in one shot (combination vaccines). Talk with your health care provider about the risks and benefits of combination vaccines. What tests do I need? Blood tests  Lipid and cholesterol levels. These may be checked every 5 years starting at age 20.  Hepatitis C test.  Hepatitis B test. Screening   Diabetes screening. This is done by checking your blood sugar (glucose) after you have not eaten for a while (fasting).  Sexually transmitted disease (STD) testing. Talk with your health care provider about your test results, treatment options, and if necessary, the need for more tests. Follow these instructions at home: Eating and drinking   Eat a diet that includes fresh fruits and vegetables, whole grains, lean protein, and low-fat dairy products.  Take vitamin and mineral supplements as recommended by your health care provider.  Do   not drink alcohol if your health care provider tells you not to drink.  If you drink alcohol: ? Limit how much you have to 0-2 drinks a day. ? Be aware of how much alcohol is in your  drink. In the [place] one drink equals one 12 oz bottle of beer (355 mL), one 5 oz glass of wine (148 mL), or one 1 oz glass of hard liquor (44 mL). Lifestyle  Take daily care of your teeth and gums.  Stay active. Exercise for at least 30 minutes on 5 or more days each week.  Do not use any products that contain nicotine or tobacco, such as cigarettes, e-cigarettes, and chewing tobacco. If you need help quitting, ask your health care provider.  If you are sexually active, practice safe sex. Use a condom or other form of protection to prevent STIs (sexually transmitted infections). What's next?  Go to your health care provider once a year for a well check visit.  Ask your health care provider how often you should have your eyes and teeth checked.  Stay up to date on all vaccines. This information is not intended to replace advice given to you by your health care provider. Make sure you discuss any questions you have with your health care provider. Document Revised: 03/13/2018 Document Reviewed: 03/13/2018 Elsevier Patient Education  2020 [employer] Inc[employer]  

## 2019-05-04 NOTE — Progress Notes (Signed)
Patient ID: William Zimmerman, male    DOB: 12-Jun-1985  Age: 34 y.o. MRN: [id number]    Subjective:  Subjective  HPI William Zimmerman presents for cpe   No complaints  Pt is using albuterol 4-5 x day and has not been using flovent regularly     No other complaints   Review of Systems  Constitutional: Negative.  Negative for appetite change, diaphoresis, fatigue and unexpected weight change.  HENT: Negative for congestion, ear pain, hearing loss, nosebleeds, postnasal drip, rhinorrhea, sinus pressure, sneezing and tinnitus.   Eyes: Negative for photophobia, pain, discharge, redness, itching and visual disturbance.  Respiratory: Negative.  Negative for cough, chest tightness, shortness of breath and wheezing.   Cardiovascular: Negative.  Negative for chest pain, palpitations and leg swelling.  Gastrointestinal: Negative for abdominal distention, abdominal pain, anal bleeding, blood in stool and constipation.  Endocrine: Negative.  Negative for cold intolerance, heat intolerance, polydipsia, polyphagia and polyuria.  Genitourinary: Negative.  Negative for difficulty urinating, dysuria and frequency.  Musculoskeletal: Negative.   Skin: Negative.   Allergic/Immunologic: Negative.   Neurological: Negative for dizziness, weakness, light-headedness, numbness and headaches.  Psychiatric/Behavioral: Negative for agitation, confusion, decreased concentration, dysphoric mood, sleep disturbance and suicidal ideas. The patient is not nervous/anxious.     History Past Medical History:  Diagnosis Date  . Asthma   . Seasonal allergies     He has no past surgical history on file.   His family history includes Arthritis in his maternal grandfather and mother; Diabetes in his maternal grandfather and mother; Hypertension in his father.He reports that he has been smoking. He has never used smokeless tobacco. He reports current alcohol use of about 4.0 standard drinks of alcohol per week. He reports current  drug use. Drug: Marijuana.  Current Outpatient Medications on File Prior to Visit  Medication Sig Dispense Refill  . albuterol (PROVENTIL) (2.5 MG/3ML) 0.083% nebulizer solution Take 3 mLs (2.5 mg total) by nebulization every 6 (six) hours as needed for wheezing or shortness of breath. 150 mL 1  . fluticasone (FLOVENT HFA) 110 MCG/ACT inhaler Inhale 2 puffs into the lungs 2 (two) times daily. 1 Inhaler 12   No current facility-administered medications on file prior to visit.     Objective:  Objective  Physical Exam Vitals and nursing note reviewed.  Constitutional:      General: He is not in acute distress.    Appearance: He is well-developed. He is not diaphoretic.  HENT:     Head: Normocephalic and atraumatic.     Right Ear: External ear normal.     Left Ear: External ear normal.     Nose: Nose normal.     Mouth/Throat:     Pharynx: No oropharyngeal exudate.  Eyes:     General:        Right eye: No discharge.        Left eye: No discharge.     Conjunctiva/sclera: Conjunctivae normal.     Pupils: Pupils are equal, round, and reactive to light.  Neck:     Thyroid: No thyromegaly.     Vascular: No JVD.  Cardiovascular:     Rate and Rhythm: Normal rate and regular rhythm.     Heart sounds: No murmur. No friction rub. No gallop.   Pulmonary:     Effort: Pulmonary effort is normal. No respiratory distress.     Breath sounds: Normal breath sounds. No wheezing or rales.  Chest:     Chest wall: No tenderness.  Abdominal:     General: Bowel sounds are normal. There is no distension.     Palpations: Abdomen is soft. There is no mass.     Tenderness: There is no abdominal tenderness. There is no guarding or rebound.  Musculoskeletal:        General: No tenderness. Normal range of motion.     Cervical back: Normal range of motion and neck supple.  Lymphadenopathy:     Cervical: No cervical adenopathy.  Skin:    General: Skin is warm and dry.     Coloration: Skin is not pale.       Findings: No erythema or rash.  Neurological:     Mental Status: He is alert and oriented to person, place, and time.     Motor: No abnormal muscle tone.     Deep Tendon Reflexes: Reflexes are normal and symmetric. Reflexes normal.  Psychiatric:        Behavior: Behavior normal.        Thought Content: Thought content normal.        Judgment: Judgment normal.    BP (!) 146/74 (BP Location: Left Arm, Cuff Size: Large)   Pulse 80   Temp 97.7 F (36.5 C) (Temporal)   Resp 12   Ht 5\' 11"  (1.803 m)   Wt 248 lb 6.4 oz (112.7 kg)   SpO2 98%   BMI 34.64 kg/m  Wt Readings from Last 3 Encounters:  05/04/19 248 lb 6.4 oz (112.7 kg)  01/06/19 230 lb 6.4 oz (104.5 kg)  12/18/18 227 lb (103 kg)     Lab Results  Component Value Date   WBC 6.8 04/29/2018   HGB 15.7 04/29/2018   HCT 47.5 04/29/2018   PLT 266.0 04/29/2018   GLUCOSE 100 (H) 04/29/2018   CHOL 209 (H) 04/29/2018   TRIG 147.0 04/29/2018   HDL 63.40 04/29/2018   LDLCALC 116 (H) 04/29/2018   ALT 26 04/29/2018   AST 23 04/29/2018   NA 140 04/29/2018   K 4.0 04/29/2018   CL 105 04/29/2018   CREATININE 1.21 04/29/2018   BUN 16 04/29/2018   CO2 28 04/29/2018   TSH 1.36 04/29/2018   MICROALBUR 7.4 (H) 04/29/2018    No results found.   Assessment & Plan:  Plan  I am having 05/01/2018 maintain his fluticasone, albuterol, and albuterol.  Meds ordered this encounter  Medications  . albuterol (VENTOLIN HFA) 108 (90 Base) MCG/ACT inhaler    Sig: INHALE 2 PUFFS INTO THE LUNGS EVERY 4 HOURS AS NEEDED FOR WHEEZING OR SHORTNESS OF BREATH    Dispense:  18 g    Refill:  1    Problem List Items Addressed This Visit      Unprioritized   Mild intermittent asthma without complication   Relevant Medications   albuterol (VENTOLIN HFA) 108 (90 Base) MCG/ACT inhaler   Preventative health care    ghm utd  Check labs  See AVS      Relevant Orders   TSH   Lipid panel   CBC with Differential/Platelet    Comprehensive metabolic panel   Uncontrolled persistent asthma    D/w with pt importance of using flovent bid and only using albuterol as needed Consider pulmonary referral if needed      Relevant Medications   albuterol (VENTOLIN HFA) 108 (90 Base) MCG/ACT inhaler      Follow-up: Return in about 1 year (around 05/03/2020) for annual exam, fasting.  07/01/2020, DO      +

## 2019-06-16 ENCOUNTER — Telehealth: Payer: Self-pay | Admitting: *Deleted

## 2019-06-16 ENCOUNTER — Other Ambulatory Visit: Payer: Self-pay

## 2019-06-16 DIAGNOSIS — J4520 Mild intermittent asthma, uncomplicated: Secondary | ICD-10-CM

## 2019-06-16 MED ORDER — ALBUTEROL SULFATE HFA 108 (90 BASE) MCG/ACT IN AERS: INHALATION_SPRAY | RESPIRATORY_TRACT | 1 refills | Status: DC

## 2019-06-16 NOTE — Telephone Encounter (Signed)
Relationship To Patient Self Return Phone Number [phone] (Primary) Chief Complaint Unclassified Symptom Reason for Call Medication Question / Request Initial Comment Caller states he is calling to get a refill, the pharmacy will not give him a refill. He is all out of his medication. He stated he has a flare up of his asthma every now and then. Translation No Nurse Assessment Nurse: Jethro Poling, RN, Kayla Date/Time (Eastern Time): 06/15/2019 5:35:38 PM Please select the assessment type ---Refill Additional Documentation ---Caller reports he needs a refill on his Albuterol Inhaler sent to [facility] on [address]. He is completely out and takes this for asthma. Caller denies any new or worsening symptoms needing triaged at this time.

## 2019-06-16 NOTE — Telephone Encounter (Signed)
refilled 

## 2019-07-05 ENCOUNTER — Emergency Department (HOSPITAL_COMMUNITY)
Admission: EM | Admit: 2019-07-05 | Discharge: 2019-07-05 | Disposition: A | Discharge status: Home / Self Care | Payer: BC Managed Care – PPO | Attending: Emergency Medicine | Admitting: Emergency Medicine

## 2019-07-05 ENCOUNTER — Encounter (HOSPITAL_COMMUNITY): Payer: Self-pay | Admitting: Emergency Medicine

## 2019-07-05 ENCOUNTER — Emergency Department (HOSPITAL_COMMUNITY): Payer: BC Managed Care – PPO

## 2019-07-05 ENCOUNTER — Other Ambulatory Visit: Payer: Self-pay

## 2019-07-05 DIAGNOSIS — J45901 Unspecified asthma with (acute) exacerbation: Secondary | ICD-10-CM | POA: Insufficient documentation

## 2019-07-05 DIAGNOSIS — F1290 Cannabis use, unspecified, uncomplicated: Secondary | ICD-10-CM | POA: Insufficient documentation

## 2019-07-05 DIAGNOSIS — R062 Wheezing: Secondary | ICD-10-CM

## 2019-07-05 DIAGNOSIS — Z87891 Personal history of nicotine dependence: Secondary | ICD-10-CM | POA: Insufficient documentation

## 2019-07-05 LAB — CBC
HCT: 48.1 % (ref 39.0–52.0)
Hemoglobin: 15.5 g/dL (ref 13.0–17.0)
MCH: 27.1 pg (ref 26.0–34.0)
MCHC: 32.2 g/dL (ref 30.0–36.0)
MCV: 83.9 fL (ref 80.0–100.0)
Platelets: 310 10*3/uL (ref 150–400)
RBC: 5.73 MIL/uL (ref 4.22–5.81)
RDW: 14.5 % (ref 11.5–15.5)
WBC: 5.8 10*3/uL (ref 4.0–10.5)
nRBC: 0 % (ref 0.0–0.2)

## 2019-07-05 LAB — BASIC METABOLIC PANEL
Anion gap: 12 (ref 5–15)
BUN: 16 mg/dL (ref 6–20)
CO2: 25 mmol/L (ref 22–32)
Calcium: 9 mg/dL (ref 8.9–10.3)
Chloride: 102 mmol/L (ref 98–111)
Creatinine, Ser: 1.07 mg/dL (ref 0.61–1.24)
GFR calc Af Amer: >60 mL/min (ref >60)
GFR calc non Af Amer: >60 mL/min (ref >60)
Glucose, Bld: 99 mg/dL (ref 70–99)
Potassium: 4 mmol/L (ref 3.5–5.1)
Sodium: 139 mmol/L (ref 135–145)

## 2019-07-05 LAB — TROPONIN I (HIGH SENSITIVITY): Troponin I (High Sensitivity): 3 ng/L (ref <18)

## 2019-07-05 MED ORDER — ALBUTEROL SULFATE HFA 108 (90 BASE) MCG/ACT IN AERS
4.0000 | INHALATION_SPRAY | Freq: Once | RESPIRATORY_TRACT | Status: AC
  Administered 2019-07-05: 6 via RESPIRATORY_TRACT
  Filled 2019-07-05: qty 6.7

## 2019-07-05 MED ORDER — ALBUTEROL SULFATE HFA 108 (90 BASE) MCG/ACT IN AERS: 1.0000 | INHALATION_SPRAY | RESPIRATORY_TRACT | 2 refills | Status: DC | PRN

## 2019-07-05 MED ORDER — AEROCHAMBER PLUS FLO-VU LARGE MISC
1.0000 | Freq: Once | Status: AC
  Administered 2019-07-05: 1

## 2019-07-05 MED ORDER — PREDNISONE 10 MG PO TABS: 50.0000 mg | ORAL_TABLET | Freq: Every day | ORAL | 0 refills | Status: AC

## 2019-07-05 NOTE — ED Notes (Signed)
Pt reports decreased shortness of breath and increased comfort following albuterol admin

## 2019-07-05 NOTE — ED Provider Notes (Signed)
[facility] EMERGENCY DEPARTMENT Provider Note   CSN: [id number] Arrival date & time: 07/05/19  0720     History Chief Complaint  Patient presents with  . Asthma    William Zimmerman is a 34 y.o. male.  HPI HPI Comments: William Zimmerman is a 34 y.o. male with a history of asthma, seasonal allergies, eczema who presents to the Emergency Department complaining of intermittent wheezing for 2 to 3 days.  Patient confirms his listed medical history including asthma and states his current symptoms feel similar to prior asthma exacerbations.  He states that he has been using his albuterol inhaler about 6 times per day with mild relief.  He most recently used his albuterol inhaler about 2 hours ago.  He reports associated mild shortness of breath as well as chest tightness when his symptoms worsen.  He confirms that he has continued to use his Flovent daily.  He confirms his listed PCP and notes that he has not reached out to her as of yet.  He has not taken any other medications for his symptoms.  He denies fevers, chills, recent illnesses, URI symptoms, chest pain, abdominal pain, nausea, vomiting, diarrhea, syncope.     Past Medical History:  Diagnosis Date  . Asthma   . Seasonal allergies     Patient Active Problem List   Diagnosis Date Noted  . Mild intermittent asthma without complication 39/76/7341  . 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 Hyperlipidemia LDL goal <100 08/12/2017  . Elevated BP without diagnosis of hypertension 04/22/2017  . GERD (gastroesophageal reflux disease) 04/22/2017  . Preventative health care 02/19/2015  . Uncontrolled persistent asthma 04/01/2013    History reviewed. No pertinent surgical history.     Family History  Problem Relation Age of Onset  . Diabetes Mother   . Arthritis Mother   . Hypertension Father   . Diabetes Maternal Grandfather   . Arthritis Maternal Grandfather     Social History     Tobacco Use  . Smoking status: Former Research scientist (life sciences)  . Smokeless tobacco: Never Used  Substance Use Topics  . Alcohol use: Yes    Alcohol/week: 4.0 standard drinks    Types: 4 Cans of beer per week    Comment: uses on the weekends only  . Drug use: Yes    Types: Marijuana    Home Medications Prior to Admission medications   Medication Sig Start Date End Date Taking? Authorizing Provider  albuterol (PROVENTIL) (2.5 MG/3ML) 0.083% nebulizer solution Take 3 mLs (2.5 mg total) by nebulization every 6 (six) hours as needed for wheezing or shortness of breath. 07/07/18   Roma Schanz R, DO  albuterol (VENTOLIN HFA) 108 (90 Base) MCG/ACT inhaler INHALE 2 PUFFS INTO THE LUNGS EVERY 4 HOURS AS NEEDED FOR WHEEZING OR SHORTNESS OF BREATH 06/16/19   Carollee Herter, Yvonne R, DO  fluticasone (FLOVENT HFA) 110 MCG/ACT inhaler Inhale 2 puffs into the lungs 2 (two) times daily. 07/07/18   [name], DO    Allergies    Patient has no known allergies.  Review of Systems   Review of Systems  Constitutional: Negative for chills and fever.  HENT: Negative for congestion, postnasal drip and sore throat.   Respiratory: Positive for cough, chest tightness, shortness of breath and wheezing.   Cardiovascular: Negative for chest pain and leg swelling.  Gastrointestinal: Negative for abdominal pain, diarrhea, nausea and vomiting.  Genitourinary: Negative for dysuria and hematuria.  Neurological: Negative for  syncope.  All other systems reviewed and are negative.  Physical Exam Updated Vital Signs BP (!) 140/94 (BP Location: Right Arm)   Pulse 84   Temp 98.3 F (36.8 C) (Oral)   Resp 16   Ht 5\' 11"  (1.803 m)   Wt 108.9 kg   SpO2 96%   BMI 33.47 kg/m   Physical Exam Vitals and nursing note reviewed.  Constitutional:      General: He is not in acute distress.    Appearance: Normal appearance. He is normal weight. He is not ill-appearing, toxic-appearing or diaphoretic.     Comments:  Pleasant adult male who is sitting upright and speaking clearly and coherently.  He answers questions appropriately.  HENT:     Head: Normocephalic and atraumatic.     Right Ear: External ear normal.     Left Ear: External ear normal.     Nose: Nose normal.     Mouth/Throat:     Mouth: Mucous membranes are moist.     Pharynx: Oropharynx is clear.  Eyes:     Extraocular Movements: Extraocular movements intact.  Cardiovascular:     Rate and Rhythm: Normal rate and regular rhythm.     Pulses: Normal pulses.     Heart sounds: Normal heart sounds. No murmur. No friction rub. No gallop.   Pulmonary:     Effort: Pulmonary effort is normal.     Breath sounds: Wheezing present.     Comments: Diffuse inspiratory and expiratory wheezes noted in the bilateral anterior and posterior lung fields.  No retractions.  No visible respiratory distress. Musculoskeletal:     Cervical back: Normal range of motion.  Skin:    General: Skin is warm and dry.  Neurological:     General: No focal deficit present.     Mental Status: He is alert and oriented to person, place, and time.  Psychiatric:        Mood and Affect: Mood normal.        Behavior: Behavior normal.    ED Results / Procedures / Treatments   Labs (all labs ordered are listed, but only abnormal results are displayed) Labs Reviewed  BASIC METABOLIC PANEL  CBC  TROPONIN I (HIGH SENSITIVITY)  TROPONIN I (HIGH SENSITIVITY)   EKG None  Radiology DG Chest 2 View  Result Date: 07/05/2019 CLINICAL DATA:  Chest tightness.  History of asthma EXAM: CHEST - 2 VIEW COMPARISON:  March 29, 2012 FINDINGS: Lungs are clear. Heart size and pulmonary vascularity are normal. No adenopathy. No pneumothorax. No bone lesions. IMPRESSION: Lungs clear.  Cardiac silhouette within normal limits. Electronically Signed   By: March 31, 2012 III M.D.   On: 07/05/2019 08:28   Procedures Procedures   Medications Ordered in ED Medications  albuterol  (VENTOLIN HFA) 108 (90 Base) MCG/ACT inhaler 4-6 puff (6 puffs Inhalation Given 07/05/19 0826)  AeroChamber Plus Flo-Vu Large MISC 1 each (1 each Other Given 07/05/19 09/04/19)   ED Course  I have reviewed the triage vital signs and the nursing notes.  Pertinent labs & imaging results that were available during my care of the patient were reviewed by me and considered in my medical decision making (see chart for details).    MDM Rules/Calculators/A&P                      8:43 AM patient is a 34 year old African-American male that presents with signs and symptoms of an asthma exacerbation.  Chest x-ray is  negative for any acute changes.  CBC is benign and troponin and BMP are in process.  His physical exam is significant for diffuse wheezing.  Will give patient albuterol and will reassess.  8:54 AM BMP and troponin are also benign.  ACS unlikely.  I discussed this with patient.  I reassess his lung sounds and his wheezing has significantly improved.  He is not tachycardic.  His O2 sats are 97%.  Patient states he feels better and is ready to be discharged.  I discussed giving him 5 days of prednisone for his asthma and he also requests that I refill his albuterol.  I will do both.  Patient verbalized understanding of the above plan and his questions were answered.  Strict return precautions given and he understands to return to the emergency department with any new or worsening symptoms.  He was amicable at the time of discharge.  His vital signs are stable.  Final Clinical Impression(s) / ED Diagnoses Final diagnoses:  Moderate asthma with exacerbation, unspecified whether persistent  Wheezing    Rx / DC Orders ED Discharge Orders         Ordered    predniSONE (DELTASONE) 10 MG tablet  Daily with breakfast     07/05/19 0907    albuterol (VENTOLIN HFA) 108 (90 Base) MCG/ACT inhaler  Every 4 hours PRN     07/05/19 0907           Placido Sou, PA-C 07/05/19 5102    Margarita Grizzle,  MD 07/06/19 1436

## 2019-07-05 NOTE — ED Notes (Signed)
Patient transported to X-ray 

## 2019-07-05 NOTE — Discharge Instructions (Addendum)
Per our discussion, I am prescribing you prednisone which is a steroid.  This medication acts as an anti-inflammatory and will help with your acute asthma exacerbation.  Please be sure to take this medication in the morning because for some people it can make it difficult to sleep at the end of the day.  Also, I have refilled your albuterol inhaler per your request.  Please follow-up with your primary care provider regarding this visit.  Please do not hesitate to return to the emergency department with any new or worsening symptoms.

## 2019-07-05 NOTE — ED Triage Notes (Signed)
Patient arrives POV c/o asthma attacks since Thursday night/ Friday morning w/o relief from his inhaler. Using approximately 6 puffs of albuterol inhaler a day with 2 puffs this am. C/o mid chest tightness.

## 2019-09-21 ENCOUNTER — Other Ambulatory Visit: Payer: Self-pay | Admitting: Family Medicine

## 2019-09-21 DIAGNOSIS — J4520 Mild intermittent asthma, uncomplicated: Secondary | ICD-10-CM

## 2019-09-21 NOTE — Telephone Encounter (Signed)
Medication:albuterol (VENTOLIN HFA) 108 (90 Base) MCG/ACT inhaler fluticasone (FLOVENT HFA) 110 MCG/ACT inhaler    Has the patient contacted their pharmacy? Yes.   (If no, request that the patient contact the pharmacy for the refill.) (If yes, when and what did the pharmacy advise?)  Preferred Pharmacy (with phone number or street name):[id number] - [place], [place] - [address] AT [facility] [address]  [address], [place] [address]  Phone:  [phone] Fax:  [phone] Agent: Please be advised that RX refills may take up to 3 business days. We ask that you follow-up with your pharmacy.

## 2019-09-22 MED ORDER — FLOVENT HFA 110 MCG/ACT IN AERO: 2.0000 | INHALATION_SPRAY | Freq: Two times a day (BID) | RESPIRATORY_TRACT | 12 refills | Status: DC

## 2019-09-22 MED ORDER — ALBUTEROL SULFATE HFA 108 (90 BASE) MCG/ACT IN AERS: INHALATION_SPRAY | RESPIRATORY_TRACT | 1 refills | Status: DC

## 2019-09-22 NOTE — Telephone Encounter (Signed)
Refills sent

## 2019-12-04 ENCOUNTER — Telehealth: Payer: Self-pay | Admitting: Family Medicine

## 2019-12-04 DIAGNOSIS — J4520 Mild intermittent asthma, uncomplicated: Secondary | ICD-10-CM

## 2019-12-04 MED ORDER — ALBUTEROL SULFATE HFA 108 (90 BASE) MCG/ACT IN AERS: INHALATION_SPRAY | RESPIRATORY_TRACT | 1 refills | Status: DC

## 2019-12-04 NOTE — Telephone Encounter (Signed)
Medication: albuterol (VENTOLIN HFA) 108 (90 Base) MCG/ACT inhaler [[id number]]    Has the patient contacted their pharmacy? No. (If no, request that the patient contact the pharmacy for the refill.) (If yes, when and what did the pharmacy advise?)  Preferred Pharmacy (with phone number or street name):  [id number] [place], [place] - [address] AT [facility] [address]  [address], [place] [address]  Phone:  [phone] Fax:  [phone]  DEA #:  [id number]        Agent: Please be advised that RX refills may take up to 3 business days. We ask that you follow-up with your pharmacy.

## 2019-12-04 NOTE — Telephone Encounter (Signed)
Refill sent.

## 2019-12-30 ENCOUNTER — Telehealth: Payer: Self-pay | Admitting: Family Medicine

## 2019-12-30 NOTE — Telephone Encounter (Signed)
Recieved

## 2019-12-30 NOTE — Telephone Encounter (Signed)
Pt dropped of BP clearance form for [place] to fill out.  Put in [place] bin up front. Pt would like to be called when finished

## 2020-01-07 ENCOUNTER — Encounter: Payer: Self-pay | Admitting: Family Medicine

## 2020-01-07 ENCOUNTER — Ambulatory Visit: Payer: BC Managed Care – PPO | Admitting: Family Medicine

## 2020-01-07 ENCOUNTER — Other Ambulatory Visit: Payer: Self-pay

## 2020-01-07 VITALS — BP 160/110 | HR 88 | Temp 98.3°F | Resp 12 | Ht 71.0 in | Wt 221.0 lb

## 2020-01-07 DIAGNOSIS — J4520 Mild intermittent asthma, uncomplicated: Secondary | ICD-10-CM | POA: Diagnosis not present

## 2020-01-07 DIAGNOSIS — I10 Essential (primary) hypertension: Secondary | ICD-10-CM | POA: Insufficient documentation

## 2020-01-07 MED ORDER — ALBUTEROL SULFATE HFA 108 (90 BASE) MCG/ACT IN AERS: INHALATION_SPRAY | RESPIRATORY_TRACT | 1 refills | Status: DC

## 2020-01-07 MED ORDER — AMLODIPINE BESYLATE 5 MG PO TABS: 5.0000 mg | ORAL_TABLET | Freq: Every day | ORAL | 2 refills | Status: DC

## 2020-01-07 NOTE — Progress Notes (Signed)
Patient ID: William Zimmerman, male    DOB: 09-14-1985  Age: 34 y.o. MRN: [id number]    Subjective:  Subjective  HPI Plover Shellhammer presents for f/u bp-- it has been running high and he needs a form filled out for work  Review of Systems  Constitutional: Negative for appetite change, diaphoresis, fatigue and unexpected weight change.  Eyes: Negative for pain, redness and visual disturbance.  Respiratory: Negative for cough, chest tightness, shortness of breath and wheezing.   Cardiovascular: Negative for chest pain, palpitations and leg swelling.  Endocrine: Negative for cold intolerance, heat intolerance, polydipsia, polyphagia and polyuria.  Genitourinary: Negative for difficulty urinating, dysuria and frequency.  Neurological: Negative for dizziness, light-headedness, numbness and headaches.    History Past Medical History:  Diagnosis Date  . Asthma   . Seasonal allergies     He has no past surgical history on file.   His family history includes Arthritis in his maternal grandfather and mother; Diabetes in his maternal grandfather and mother; Hypertension in his father and mother.He reports that he has quit smoking. He has never used smokeless tobacco. He reports current alcohol use of about 4.0 standard drinks of alcohol per week. He reports current drug use. Drug: Marijuana.  Current Outpatient Medications on File Prior to Visit  Medication Sig Dispense Refill  . fluticasone (FLOVENT HFA) 110 MCG/ACT inhaler Inhale 2 puffs into the lungs 2 (two) times daily. 1 Inhaler 12   No current facility-administered medications on file prior to visit.     Objective:  Objective  Physical Exam Vitals and nursing note reviewed.  Constitutional:      General: He is sleeping.     Appearance: He is well-developed.  HENT:     Head: Normocephalic and atraumatic.  Eyes:     Pupils: Pupils are equal, round, and reactive to light.  Neck:     Thyroid: No thyromegaly.  Cardiovascular:      Rate and Rhythm: Normal rate and regular rhythm.     Heart sounds: No murmur heard.   Pulmonary:     Effort: Pulmonary effort is normal. No respiratory distress.     Breath sounds: Normal breath sounds. No wheezing or rales.  Chest:     Chest wall: No tenderness.  Musculoskeletal:        General: No tenderness.     Cervical back: Normal range of motion and neck supple.  Skin:    General: Skin is warm and dry.  Neurological:     Mental Status: He is oriented to person, place, and time.  Psychiatric:        Behavior: Behavior normal.        Thought Content: Thought content normal.        Judgment: Judgment normal.    BP (!) 160/110 (BP Location: Right Arm, Cuff Size: Large)   Pulse 88   Temp 98.3 F (36.8 C) (Oral)   Resp 12   Ht 5\' 11"  (1.803 m)   Wt 221 lb (100.2 kg)   SpO2 97%   BMI 30.82 kg/m  Wt Readings from Last 3 Encounters:  01/07/20 221 lb (100.2 kg)  07/05/19 240 lb (108.9 kg)  05/04/19 248 lb 6.4 oz (112.7 kg)     Lab Results  Component Value Date   WBC 5.8 07/05/2019   HGB 15.5 07/05/2019   HCT 48.1 07/05/2019   PLT 310 07/05/2019   GLUCOSE 99 07/05/2019   CHOL 204 (H) 05/04/2019   TRIG 166.0 (H) 05/04/2019  HDL 56.20 05/04/2019   LDLCALC 115 (H) 05/04/2019   ALT 35 05/04/2019   AST 26 05/04/2019   NA 139 07/05/2019   K 4.0 07/05/2019   CL 102 07/05/2019   CREATININE 1.07 07/05/2019   BUN 16 07/05/2019   CO2 25 07/05/2019   TSH 0.62 05/04/2019   MICROALBUR 7.4 (H) 04/29/2018    DG Chest 2 View  Result Date: 07/05/2019 CLINICAL DATA:  Chest tightness.  History of asthma EXAM: CHEST - 2 VIEW COMPARISON:  March 29, 2012 FINDINGS: Lungs are clear. Heart size and pulmonary vascularity are normal. No adenopathy. No pneumothorax. No bone lesions. IMPRESSION: Lungs clear.  Cardiac silhouette within normal limits. Electronically Signed   By: Bretta Bang III M.D.   On: 07/05/2019 08:28     Assessment & Plan:  Plan  I am having Teodoro Spray start on amLODipine. I am also having him maintain his Flovent HFA and albuterol.  Meds ordered this encounter  Medications  . albuterol (VENTOLIN HFA) 108 (90 Base) MCG/ACT inhaler    Sig: INHALE 2 PUFFS INTO THE LUNGS EVERY 4 HOURS AS NEEDED FOR WHEEZING OR SHORTNESS OF BREATH    Dispense:  18 g    Refill:  1  . amLODipine (NORVASC) 5 MG tablet    Sig: Take 1 tablet (5 mg total) by mouth daily.    Dispense:  30 tablet    Refill:  2    Problem List Items Addressed This Visit      Unprioritized   Mild intermittent asthma without complication   Relevant Medications   albuterol (VENTOLIN HFA) 108 (90 Base) MCG/ACT inhaler   Primary hypertension - Primary    Poorly controlled will alter medications, encouraged DASH diet, minimize caffeine and obtain adequate sleep. Report concerning symptoms and follow up as directed and as needed      Relevant Medications   amLODipine (NORVASC) 5 MG tablet      Follow-up: Return in about 2 weeks (around 01/21/2020), or if symptoms worsen or fail to improve, for hypertension.  Donato Schultz, DO

## 2020-01-07 NOTE — Assessment & Plan Note (Signed)
Poorly controlled will alter medications, encouraged DASH diet, minimize caffeine and obtain adequate sleep. Report concerning symptoms and follow up as directed and as needed 

## 2020-01-07 NOTE — Patient Instructions (Signed)
DASH Eating Plan DASH stands for "Dietary Approaches to Stop Hypertension." The DASH eating plan is a healthy eating plan that has been shown to reduce high blood pressure (hypertension). It may also reduce your risk for type 2 diabetes, heart disease, and stroke. The DASH eating plan may also help with weight loss. What are tips for following this plan?  General guidelines  Avoid eating more than 2,300 mg (milligrams) of salt (sodium) a day. If you have hypertension, you may need to reduce your sodium intake to 1,500 mg a day.  Limit alcohol intake to no more than 1 drink a day for nonpregnant women and 2 drinks a day for men. One drink equals 12 oz of beer, 5 oz of wine, or 1 oz of hard liquor.  Work with your health care provider to maintain a healthy body weight or to lose weight. Ask what an ideal weight is for you.  Get at least 30 minutes of exercise that causes your heart to beat faster (aerobic exercise) most days of the week. Activities may include walking, swimming, or biking.  Work with your health care provider or diet and nutrition specialist (dietitian) to adjust your eating plan to your individual calorie needs. Reading food labels   Check food labels for the amount of sodium per serving. Choose foods with less than 5 percent of the Daily Value of sodium. Generally, foods with less than 300 mg of sodium per serving fit into this eating plan.  To find whole grains, look for the word "whole" as the first word in the ingredient list. Shopping  Buy products labeled as "low-sodium" or "no salt added."  Buy fresh foods. Avoid canned foods and premade or frozen meals. Cooking  Avoid adding salt when cooking. Use salt-free seasonings or herbs instead of table salt or sea salt. Check with your health care provider or pharmacist before using salt substitutes.  Do not fry foods. Cook foods using healthy methods such as baking, boiling, grilling, and broiling instead.  Cook with  heart-healthy oils, such as olive, canola, soybean, or sunflower oil. Meal planning  Eat a balanced diet that includes: ? 5 or more servings of fruits and vegetables each day. At each meal, try to fill half of your plate with fruits and vegetables. ? Up to 6-8 servings of whole grains each day. ? Less than 6 oz of lean meat, poultry, or fish each day. A 3-oz serving of meat is about the same size as a deck of cards. One egg equals 1 oz. ? 2 servings of low-fat dairy each day. ? A serving of nuts, seeds, or beans 5 times each week. ? Heart-healthy fats. Healthy fats called Omega-3 fatty acids are found in foods such as flaxseeds and coldwater fish, like sardines, salmon, and mackerel.  Limit how much you eat of the following: ? Canned or prepackaged foods. ? Food that is high in trans fat, such as fried foods. ? Food that is high in saturated fat, such as fatty meat. ? Sweets, desserts, sugary drinks, and other foods with added sugar. ? Full-fat dairy products.  Do not salt foods before eating.  Try to eat at least 2 vegetarian meals each week.  Eat more home-cooked food and less restaurant, buffet, and fast food.  When eating at a restaurant, ask that your food be prepared with less salt or no salt, if possible. What foods are recommended? The items listed may not be a complete list. Talk with your dietitian about   what dietary choices are best for you. Grains Whole-grain or whole-wheat bread. Whole-grain or whole-wheat pasta. Brown rice. Oatmeal. Quinoa. Bulgur. Whole-grain and low-sodium cereals. Pita bread. Low-fat, low-sodium crackers. Whole-wheat flour tortillas. Vegetables Fresh or frozen vegetables (raw, steamed, roasted, or grilled). Low-sodium or reduced-sodium tomato and vegetable juice. Low-sodium or reduced-sodium tomato sauce and tomato paste. Low-sodium or reduced-sodium canned vegetables. Fruits All fresh, dried, or frozen fruit. Canned fruit in natural juice (without  added sugar). Meat and other protein foods Skinless chicken or turkey. Ground chicken or turkey. Pork with fat trimmed off. Fish and seafood. Egg whites. Dried beans, peas, or lentils. Unsalted nuts, nut butters, and seeds. Unsalted canned beans. Lean cuts of beef with fat trimmed off. Low-sodium, lean deli meat. Dairy Low-fat (1%) or fat-free (skim) milk. Fat-free, low-fat, or reduced-fat cheeses. Nonfat, low-sodium ricotta or cottage cheese. Low-fat or nonfat yogurt. Low-fat, low-sodium cheese. Fats and oils Soft margarine without trans fats. Vegetable oil. Low-fat, reduced-fat, or light mayonnaise and salad dressings (reduced-sodium). Canola, safflower, olive, soybean, and sunflower oils. Avocado. Seasoning and other foods Herbs. Spices. Seasoning mixes without salt. Unsalted popcorn and pretzels. Fat-free sweets. What foods are not recommended? The items listed may not be a complete list. Talk with your dietitian about what dietary choices are best for you. Grains Baked goods made with fat, such as croissants, muffins, or some breads. Dry pasta or rice meal packs. Vegetables Creamed or fried vegetables. Vegetables in a cheese sauce. Regular canned vegetables (not low-sodium or reduced-sodium). Regular canned tomato sauce and paste (not low-sodium or reduced-sodium). Regular tomato and vegetable juice (not low-sodium or reduced-sodium). Pickles. Olives. Fruits Canned fruit in a light or heavy syrup. Fried fruit. Fruit in cream or butter sauce. Meat and other protein foods Fatty cuts of meat. Ribs. Fried meat. Bacon. Sausage. Bologna and other processed lunch meats. Salami. Fatback. Hotdogs. Bratwurst. Salted nuts and seeds. Canned beans with added salt. Canned or smoked fish. Whole eggs or egg yolks. Chicken or turkey with skin. Dairy Whole or 2% milk, cream, and half-and-half. Whole or full-fat cream cheese. Whole-fat or sweetened yogurt. Full-fat cheese. Nondairy creamers. Whipped toppings.  Processed cheese and cheese spreads. Fats and oils Butter. Stick margarine. Lard. Shortening. Ghee. Bacon fat. Tropical oils, such as coconut, palm kernel, or palm oil. Seasoning and other foods Salted popcorn and pretzels. Onion salt, garlic salt, seasoned salt, table salt, and sea salt. Worcestershire sauce. Tartar sauce. Barbecue sauce. Teriyaki sauce. Soy sauce, including reduced-sodium. Steak sauce. Canned and packaged gravies. Fish sauce. Oyster sauce. Cocktail sauce. Horseradish that you find on the shelf. Ketchup. Mustard. Meat flavorings and tenderizers. Bouillon cubes. Hot sauce and Tabasco sauce. Premade or packaged marinades. Premade or packaged taco seasonings. Relishes. Regular salad dressings. Where to find more information:  National Heart, Lung, and Blood Institute: www.nhlbi.nih.gov  American Heart Association: www.heart.org Summary  The DASH eating plan is a healthy eating plan that has been shown to reduce high blood pressure (hypertension). It may also reduce your risk for type 2 diabetes, heart disease, and stroke.  With the DASH eating plan, you should limit salt (sodium) intake to 2,300 mg a day. If you have hypertension, you may need to reduce your sodium intake to 1,500 mg a day.  When on the DASH eating plan, aim to eat more fresh fruits and vegetables, whole grains, lean proteins, low-fat dairy, and heart-healthy fats.  Work with your health care provider or diet and nutrition specialist (dietitian) to adjust your eating plan to your   individual calorie needs. This information is not intended to replace advice given to you by your health care provider. Make sure you discuss any questions you have with your health care provider. Document Revised: 03/01/2017 Document Reviewed: 03/12/2016 Elsevier Patient Education  2020 Elsevier Inc.  

## 2020-01-18 ENCOUNTER — Encounter: Payer: Self-pay | Admitting: Pulmonary Disease

## 2020-01-18 ENCOUNTER — Other Ambulatory Visit: Payer: Self-pay

## 2020-01-18 ENCOUNTER — Ambulatory Visit: Payer: BC Managed Care – PPO | Admitting: Pulmonary Disease

## 2020-01-18 VITALS — BP 130/80 | HR 82 | Temp 97.9°F | Ht 71.0 in | Wt 222.6 lb

## 2020-01-18 DIAGNOSIS — G4733 Obstructive sleep apnea (adult) (pediatric): Secondary | ICD-10-CM

## 2020-01-18 NOTE — Patient Instructions (Signed)
We will schedule you for home sleep study  Update your results as soon as reviewed  Tentative follow-up in about 4 to 6 weeks from now

## 2020-01-18 NOTE — Progress Notes (Signed)
William Zimmerman    [id number]    12/22/1985  Primary Care Physician:Lowne Chase, Grayling Congress, DO  Referring Physician: Zola Button, Grayling Congress, DO [address] [place],  [place] [address]  Chief complaint:   Follow-up for obstructive sleep apnea  HPI: Patient is currently on CPAP therapy  He has lost weight, diet and exercise Is compliance currently shows about 70% compliance Average use of 4 hours 24 minutes AHI of 0.9 He feels the machine works, has not gotten used to using it  Study showed mild obstructive sleep apnea  Discussion about repeating the study to see if he still has significant sleep disordered breathing since is managed to lose some weight and exercise regularly currently  Drives for [employer]  He does have a history of snoring No witnessed apneas  No family history of obstructive sleep apnea known to patient  Sleep environment is conducive to sleep  Tries to get in bed by 8-9PM, takes him about 20 to 30 minutes to fall asleep Wakes up about once during the night to use the restroom Final awakening time about 2:30 AM  Neck size is 17-1/2    Outpatient Encounter Medications as of 01/18/2020  Medication Sig  . albuterol (VENTOLIN HFA) 108 (90 Base) MCG/ACT inhaler INHALE 2 PUFFS INTO THE LUNGS EVERY 4 HOURS AS NEEDED FOR WHEEZING OR SHORTNESS OF BREATH  . amLODipine (NORVASC) 5 MG tablet Take 1 tablet (5 mg total) by mouth daily.  . fluticasone (FLOVENT HFA) 110 MCG/ACT inhaler Inhale 2 puffs into the lungs 2 (two) times daily.   No facility-administered encounter medications on file as of 01/18/2020.    Allergies as of 01/18/2020  . (No Known Allergies)    Past Medical History:  Diagnosis Date  . Asthma   . Seasonal allergies     No past surgical history on file.  Family History  Problem Relation Age of Onset  . Diabetes Mother   . Arthritis Mother   . Hypertension Mother   . Hypertension Father   . Diabetes Maternal  Grandfather   . Arthritis Maternal Grandfather     Social History   Socioeconomic History  . Marital status: Married    Spouse name: Not on file  . Number of children: Not on file  . Years of education: Not on file  . Highest education level: Not on file  Occupational History  . Not on file  Tobacco Use  . Smoking status: Former Games developer  . Smokeless tobacco: Never Used  Substance and Sexual Activity  . Alcohol use: Yes    Alcohol/week: 4.0 standard drinks    Types: 4 Cans of beer per week    Comment: uses on the weekends only  . Drug use: Yes    Types: Marijuana  . Sexual activity: Not on file  Other Topics Concern  . Not on file  Social History Narrative  . Not on file   Social Determinants of Health   Financial Resource Strain:   . Difficulty of Paying Living Expenses: Not on file  Food Insecurity:   . Worried About Programme researcher, broadcasting/film/video in the Last Year: Not on file  . Ran Out of Food in the Last Year: Not on file  Transportation Needs:   . Lack of Transportation (Medical): Not on file  . Lack of Transportation (Non-Medical): Not on file  Physical Activity:   . Days of Exercise per Week: Not on  file  . Minutes of Exercise per Session: Not on file  Stress:   . Feeling of Stress : Not on file  Social Connections:   . Frequency of Communication with Friends and Family: Not on file  . Frequency of Social Gatherings with Friends and Family: Not on file  . Attends Religious Services: Not on file  . Active Member of Clubs or Organizations: Not on file  . Attends Banker Meetings: Not on file  . Marital Status: Not on file  Intimate Partner Violence:   . Fear of Current or Ex-Partner: Not on file  . Emotionally Abused: Not on file  . Physically Abused: Not on file  . Sexually Abused: Not on file    Review of Systems  Constitutional: Negative.   HENT: Negative.   Respiratory: Negative.   Cardiovascular: Negative.   Gastrointestinal: Negative.     Musculoskeletal: Negative.   All other systems reviewed and are negative.   Vitals:   01/18/20 1049  BP: 130/80  Pulse: 82  Temp: 97.9 F (36.6 C)  SpO2: 100%     Physical Exam HENT:     Head: Normocephalic and atraumatic.  Eyes:     General:        Right eye: No discharge.        Left eye: No discharge.  Neck:     Thyroid: No thyromegaly.     Trachea: No tracheal deviation.  Cardiovascular:     Rate and Rhythm: Normal rate and regular rhythm.  Pulmonary:     Effort: Pulmonary effort is normal. No respiratory distress.     Breath sounds: Normal breath sounds. No wheezing or rales.  Musculoskeletal:     Cervical back: Normal range of motion and neck supple.  Neurological:     Mental Status: He is alert.  Psychiatric:        Mood and Affect: Mood normal.     Results of the Epworth flowsheet 01/18/2020 12/18/2018  Sitting and reading 0 0  Watching TV 1 1  Sitting, inactive in a public place (e.g. a theatre or a meeting) 0 0  As a passenger in a car for an hour without a break 1 0  Lying down to rest in the afternoon when circumstances permit 0 1  Sitting and talking to someone 0 0  Sitting quietly after a lunch without alcohol 0 0  In a car, while stopped for a few minutes in traffic 0 0  Total score 2 2   Neck size-17-1/2 inches   Compliance data shows 70% compliance, machine set between 5 and 15 AHI of 0.9  Assessment:   Mild obstructive sleep apnea  -Obesity -Losing weight and feeling better generally  Need for adequate hours of sleep discussed  Plan/Recommendations:  Order home sleep study-this is to confirm improvement in sleep apnea events  Obtain adequate hours of sleep  Tentative follow-up in about 4 to 6 weeks   Virl Diamond MD [facility] 01/18/2020, 11:11 AM  CC: Donato Schultz, *

## 2020-02-01 ENCOUNTER — Ambulatory Visit: Payer: BC Managed Care – PPO | Admitting: Family Medicine

## 2020-02-01 ENCOUNTER — Other Ambulatory Visit: Payer: Self-pay

## 2020-02-01 ENCOUNTER — Encounter: Payer: Self-pay | Admitting: Family Medicine

## 2020-02-01 DIAGNOSIS — J4520 Mild intermittent asthma, uncomplicated: Secondary | ICD-10-CM

## 2020-02-01 DIAGNOSIS — I10 Essential (primary) hypertension: Secondary | ICD-10-CM

## 2020-02-01 MED ORDER — FLOVENT HFA 110 MCG/ACT IN AERO: 2.0000 | INHALATION_SPRAY | Freq: Two times a day (BID) | RESPIRATORY_TRACT | 3 refills | Status: DC

## 2020-02-01 MED ORDER — ALBUTEROL SULFATE HFA 108 (90 BASE) MCG/ACT IN AERS: INHALATION_SPRAY | RESPIRATORY_TRACT | 1 refills | Status: DC

## 2020-02-01 MED ORDER — AMLODIPINE BESYLATE 10 MG PO TABS: 10.0000 mg | ORAL_TABLET | Freq: Every day | ORAL | 2 refills | Status: DC

## 2020-02-01 NOTE — Assessment & Plan Note (Signed)
Make sure to use flovent bid and albuterol prn If pt still needs to use albuterol more than 2-3 x a week f/u in office

## 2020-02-01 NOTE — Assessment & Plan Note (Signed)
Increase norvasc to 10 mg daily  F/u 3 months or sooner prn

## 2020-02-01 NOTE — Progress Notes (Signed)
Patient ID: William Zimmerman, male    DOB: 1986-03-24  Age: 34 y.o. MRN: [id number]    Subjective:  Subjective  HPI William Zimmerman presents for f/u bp.  No complaints   Review of Systems  Constitutional: Negative for appetite change, diaphoresis, fatigue and unexpected weight change.  Eyes: Negative for pain, redness and visual disturbance.  Respiratory: Negative for cough, chest tightness, shortness of breath and wheezing.   Cardiovascular: Negative for chest pain, palpitations and leg swelling.  Endocrine: Negative for cold intolerance, heat intolerance, polydipsia, polyphagia and polyuria.  Genitourinary: Negative for difficulty urinating, dysuria and frequency.  Neurological: Negative for dizziness, light-headedness, numbness and headaches.    History Past Medical History:  Diagnosis Date  . Asthma   . Seasonal allergies     He has no past surgical history on file.   His family history includes Arthritis in his maternal grandfather and mother; Diabetes in his maternal grandfather and mother; Hypertension in his father and mother.He reports that he has quit smoking. He has never used smokeless tobacco. He reports current alcohol use of about 4.0 standard drinks of alcohol per week. He reports current drug use. Drug: Marijuana.  No current outpatient medications on file prior to visit.   No current facility-administered medications on file prior to visit.     Objective:  Objective  Physical Exam Vitals and nursing note reviewed.  Constitutional:      General: He is sleeping.     Appearance: He is well-developed.  HENT:     Head: Normocephalic and atraumatic.  Eyes:     Pupils: Pupils are equal, round, and reactive to light.  Neck:     Thyroid: No thyromegaly.  Cardiovascular:     Rate and Rhythm: Normal rate and regular rhythm.     Heart sounds: No murmur heard.   Pulmonary:     Effort: Pulmonary effort is normal. No respiratory distress.     Breath sounds: Normal  breath sounds. No wheezing or rales.  Chest:     Chest wall: No tenderness.  Musculoskeletal:        General: No tenderness.     Cervical back: Normal range of motion and neck supple.  Skin:    General: Skin is warm and dry.  Neurological:     Mental Status: He is oriented to person, place, and time.  Psychiatric:        Behavior: Behavior normal.        Thought Content: Thought content normal.        Judgment: Judgment normal.    BP (!) 146/96 (BP Location: Left Arm, Cuff Size: Large)   Pulse 77   Temp 98.5 F (36.9 C) (Oral)   Resp 12   Ht 5\' 11"  (1.803 m)   Wt 228 lb 6.4 oz (103.6 kg)   SpO2 97%   BMI 31.86 kg/m  Wt Readings from Last 3 Encounters:  02/01/20 228 lb 6.4 oz (103.6 kg)  01/18/20 222 lb 9.6 oz (101 kg)  01/07/20 221 lb (100.2 kg)     Lab Results  Component Value Date   WBC 5.8 07/05/2019   HGB 15.5 07/05/2019   HCT 48.1 07/05/2019   PLT 310 07/05/2019   GLUCOSE 99 07/05/2019   CHOL 204 (H) 05/04/2019   TRIG 166.0 (H) 05/04/2019   HDL 56.20 05/04/2019   LDLCALC 115 (H) 05/04/2019   ALT 35 05/04/2019   AST 26 05/04/2019   NA 139 07/05/2019   K 4.0 07/05/2019  CL 102 07/05/2019   CREATININE 1.07 07/05/2019   BUN 16 07/05/2019   CO2 25 07/05/2019   TSH 0.62 05/04/2019   MICROALBUR 7.4 (H) 04/29/2018    DG Chest 2 View  Result Date: 07/05/2019 CLINICAL DATA:  Chest tightness.  History of asthma EXAM: CHEST - 2 VIEW COMPARISON:  March 29, 2012 FINDINGS: Lungs are clear. Heart size and pulmonary vascularity are normal. No adenopathy. No pneumothorax. No bone lesions. IMPRESSION: Lungs clear.  Cardiac silhouette within normal limits. Electronically Signed   By: Bretta Bang III M.D.   On: 07/05/2019 08:28     Assessment & Plan:  Plan  I have discontinued William Zimmerman's amLODipine. I am also having him start on amLODipine. Additionally, I am having him maintain his albuterol and Flovent HFA.  Meds ordered this encounter  Medications    . amLODipine (NORVASC) 10 MG tablet    Sig: Take 1 tablet (10 mg total) by mouth daily.    Dispense:  30 tablet    Refill:  2  . albuterol (VENTOLIN HFA) 108 (90 Base) MCG/ACT inhaler    Sig: INHALE 2 PUFFS INTO THE LUNGS EVERY 4 HOURS AS NEEDED FOR WHEEZING OR SHORTNESS OF BREATH    Dispense:  18 g    Refill:  1  . fluticasone (FLOVENT HFA) 110 MCG/ACT inhaler    Sig: Inhale 2 puffs into the lungs 2 (two) times daily.    Dispense:  1 each    Refill:  3    Problem List Items Addressed This Visit      Unprioritized   Mild intermittent asthma without complication    Make sure to use flovent bid and albuterol prn If pt still needs to use albuterol more than 2-3 x a week f/u in office       Relevant Medications   albuterol (VENTOLIN HFA) 108 (90 Base) MCG/ACT inhaler   fluticasone (FLOVENT HFA) 110 MCG/ACT inhaler   Primary hypertension    Increase norvasc to 10 mg daily  F/u 3 months or sooner prn       Relevant Medications   amLODipine (NORVASC) 10 MG tablet      Follow-up: Return in about 3 months (around 05/03/2020), or if symptoms worsen or fail to improve, for hypertension, fasting.  Donato Schultz, DO

## 2020-02-01 NOTE — Patient Instructions (Signed)
DASH Eating Plan DASH stands for "Dietary Approaches to Stop Hypertension." The DASH eating plan is a healthy eating plan that has been shown to reduce high blood pressure (hypertension). It may also reduce your risk for type 2 diabetes, heart disease, and stroke. The DASH eating plan may also help with weight loss. What are tips for following this plan?  General guidelines  Avoid eating more than 2,300 mg (milligrams) of salt (sodium) a day. If you have hypertension, you may need to reduce your sodium intake to 1,500 mg a day.  Limit alcohol intake to no more than 1 drink a day for nonpregnant women and 2 drinks a day for men. One drink equals 12 oz of beer, 5 oz of wine, or 1 oz of hard liquor.  Work with your health care provider to maintain a healthy body weight or to lose weight. Ask what an ideal weight is for you.  Get at least 30 minutes of exercise that causes your heart to beat faster (aerobic exercise) most days of the week. Activities may include walking, swimming, or biking.  Work with your health care provider or diet and nutrition specialist (dietitian) to adjust your eating plan to your individual calorie needs. Reading food labels   Check food labels for the amount of sodium per serving. Choose foods with less than 5 percent of the Daily Value of sodium. Generally, foods with less than 300 mg of sodium per serving fit into this eating plan.  To find whole grains, look for the word "whole" as the first word in the ingredient list. Shopping  Buy products labeled as "low-sodium" or "no salt added."  Buy fresh foods. Avoid canned foods and premade or frozen meals. Cooking  Avoid adding salt when cooking. Use salt-free seasonings or herbs instead of table salt or sea salt. Check with your health care provider or pharmacist before using salt substitutes.  Do not fry foods. Cook foods using healthy methods such as baking, boiling, grilling, and broiling instead.  Cook with  heart-healthy oils, such as olive, canola, soybean, or sunflower oil. Meal planning  Eat a balanced diet that includes: ? 5 or more servings of fruits and vegetables each day. At each meal, try to fill half of your plate with fruits and vegetables. ? Up to 6-8 servings of whole grains each day. ? Less than 6 oz of lean meat, poultry, or fish each day. A 3-oz serving of meat is about the same size as a deck of cards. One egg equals 1 oz. ? 2 servings of low-fat dairy each day. ? A serving of nuts, seeds, or beans 5 times each week. ? Heart-healthy fats. Healthy fats called Omega-3 fatty acids are found in foods such as flaxseeds and coldwater fish, like sardines, salmon, and mackerel.  Limit how much you eat of the following: ? Canned or prepackaged foods. ? Food that is high in trans fat, such as fried foods. ? Food that is high in saturated fat, such as fatty meat. ? Sweets, desserts, sugary drinks, and other foods with added sugar. ? Full-fat dairy products.  Do not salt foods before eating.  Try to eat at least 2 vegetarian meals each week.  Eat more home-cooked food and less restaurant, buffet, and fast food.  When eating at a restaurant, ask that your food be prepared with less salt or no salt, if possible. What foods are recommended? The items listed may not be a complete list. Talk with your dietitian about   what dietary choices are best for you. Grains Whole-grain or whole-wheat bread. Whole-grain or whole-wheat pasta. Brown rice. Oatmeal. Quinoa. Bulgur. Whole-grain and low-sodium cereals. Pita bread. Low-fat, low-sodium crackers. Whole-wheat flour tortillas. Vegetables Fresh or frozen vegetables (raw, steamed, roasted, or grilled). Low-sodium or reduced-sodium tomato and vegetable juice. Low-sodium or reduced-sodium tomato sauce and tomato paste. Low-sodium or reduced-sodium canned vegetables. Fruits All fresh, dried, or frozen fruit. Canned fruit in natural juice (without  added sugar). Meat and other protein foods Skinless chicken or turkey. Ground chicken or turkey. Pork with fat trimmed off. Fish and seafood. Egg whites. Dried beans, peas, or lentils. Unsalted nuts, nut butters, and seeds. Unsalted canned beans. Lean cuts of beef with fat trimmed off. Low-sodium, lean deli meat. Dairy Low-fat (1%) or fat-free (skim) milk. Fat-free, low-fat, or reduced-fat cheeses. Nonfat, low-sodium ricotta or cottage cheese. Low-fat or nonfat yogurt. Low-fat, low-sodium cheese. Fats and oils Soft margarine without trans fats. Vegetable oil. Low-fat, reduced-fat, or light mayonnaise and salad dressings (reduced-sodium). Canola, safflower, olive, soybean, and sunflower oils. Avocado. Seasoning and other foods Herbs. Spices. Seasoning mixes without salt. Unsalted popcorn and pretzels. Fat-free sweets. What foods are not recommended? The items listed may not be a complete list. Talk with your dietitian about what dietary choices are best for you. Grains Baked goods made with fat, such as croissants, muffins, or some breads. Dry pasta or rice meal packs. Vegetables Creamed or fried vegetables. Vegetables in a cheese sauce. Regular canned vegetables (not low-sodium or reduced-sodium). Regular canned tomato sauce and paste (not low-sodium or reduced-sodium). Regular tomato and vegetable juice (not low-sodium or reduced-sodium). Pickles. Olives. Fruits Canned fruit in a light or heavy syrup. Fried fruit. Fruit in cream or butter sauce. Meat and other protein foods Fatty cuts of meat. Ribs. Fried meat. Bacon. Sausage. Bologna and other processed lunch meats. Salami. Fatback. Hotdogs. Bratwurst. Salted nuts and seeds. Canned beans with added salt. Canned or smoked fish. Whole eggs or egg yolks. Chicken or turkey with skin. Dairy Whole or 2% milk, cream, and half-and-half. Whole or full-fat cream cheese. Whole-fat or sweetened yogurt. Full-fat cheese. Nondairy creamers. Whipped toppings.  Processed cheese and cheese spreads. Fats and oils Butter. Stick margarine. Lard. Shortening. Ghee. Bacon fat. Tropical oils, such as coconut, palm kernel, or palm oil. Seasoning and other foods Salted popcorn and pretzels. Onion salt, garlic salt, seasoned salt, table salt, and sea salt. Worcestershire sauce. Tartar sauce. Barbecue sauce. Teriyaki sauce. Soy sauce, including reduced-sodium. Steak sauce. Canned and packaged gravies. Fish sauce. Oyster sauce. Cocktail sauce. Horseradish that you find on the shelf. Ketchup. Mustard. Meat flavorings and tenderizers. Bouillon cubes. Hot sauce and Tabasco sauce. Premade or packaged marinades. Premade or packaged taco seasonings. Relishes. Regular salad dressings. Where to find more information:  National Heart, Lung, and Blood Institute: www.nhlbi.nih.gov  American Heart Association: www.heart.org Summary  The DASH eating plan is a healthy eating plan that has been shown to reduce high blood pressure (hypertension). It may also reduce your risk for type 2 diabetes, heart disease, and stroke.  With the DASH eating plan, you should limit salt (sodium) intake to 2,300 mg a day. If you have hypertension, you may need to reduce your sodium intake to 1,500 mg a day.  When on the DASH eating plan, aim to eat more fresh fruits and vegetables, whole grains, lean proteins, low-fat dairy, and heart-healthy fats.  Work with your health care provider or diet and nutrition specialist (dietitian) to adjust your eating plan to your   individual calorie needs. This information is not intended to replace advice given to you by your health care provider. Make sure you discuss any questions you have with your health care provider. Document Revised: 03/01/2017 Document Reviewed: 03/12/2016 Elsevier Patient Education  2020 Elsevier Inc.  

## 2020-02-22 ENCOUNTER — Ambulatory Visit: Payer: BC Managed Care – PPO | Admitting: Pulmonary Disease

## 2020-02-23 ENCOUNTER — Other Ambulatory Visit: Payer: Self-pay

## 2020-02-23 ENCOUNTER — Ambulatory Visit: Payer: BC Managed Care – PPO

## 2020-02-23 DIAGNOSIS — G4733 Obstructive sleep apnea (adult) (pediatric): Secondary | ICD-10-CM

## 2020-03-01 ENCOUNTER — Telehealth: Payer: Self-pay | Admitting: Pulmonary Disease

## 2020-03-01 DIAGNOSIS — G4733 Obstructive sleep apnea (adult) (pediatric): Secondary | ICD-10-CM | POA: Diagnosis not present

## 2020-03-01 NOTE — Telephone Encounter (Signed)
Call patient  Sleep study result  Date of study: 02/23/2020  Impression: Negative study for significant sleep disordered breathing  Recommendation:  In the context of significant weight loss and improvement in symptoms CPAP therapy may be discontinued  If there is significant clinical concern for sleep disordered breathing, an in lab polysomnogram may be considered however a negative study will suggest absence of moderate to severe disease  Follow-up as needed

## 2020-03-01 NOTE — Telephone Encounter (Signed)
Tried calling the pt and there was no answer- LMTCB.  

## 2020-03-02 ENCOUNTER — Encounter: Payer: Self-pay | Admitting: *Deleted

## 2020-03-02 NOTE — Telephone Encounter (Signed)
ATC the pt with results again, NA and the mailbox is now full. Letter mailed for pt to call per protocol.

## 2020-03-08 ENCOUNTER — Telehealth: Payer: Self-pay | Admitting: Pulmonary Disease

## 2020-03-08 NOTE — Telephone Encounter (Signed)
03/08/2020  Was able to contact the patient.  Reviewed most recent home sleep study results with patient.  Patient does not have obstructive sleep apnea.  Reviewed the Dr. Val Eagle recommended stopping CPAP therapy.  Patient would like to documentation of Dr. Kary Kos recommendations as well as most recent home sleep study.  We will route to Amy for follow-up.  Can we please print the most recent home sleep study as well as the telephone note from Dr. Val Eagle recommending that he stop CPAP therapy.  Patient is aware to follow-up with our office as needed  We will route to Amy for follow-up  William Headland, FNP

## 2020-03-09 NOTE — Telephone Encounter (Signed)
I have printed out and mailed requested documents to patient. Nothing further needed at this time.

## 2020-04-04 ENCOUNTER — Other Ambulatory Visit: Payer: Self-pay | Admitting: Family Medicine

## 2020-04-04 DIAGNOSIS — J4520 Mild intermittent asthma, uncomplicated: Secondary | ICD-10-CM

## 2020-05-02 ENCOUNTER — Ambulatory Visit: Payer: BC Managed Care – PPO | Admitting: Family Medicine

## 2020-05-02 ENCOUNTER — Other Ambulatory Visit: Payer: Self-pay

## 2020-05-02 ENCOUNTER — Encounter: Payer: Self-pay | Admitting: Family Medicine

## 2020-05-02 VITALS — BP 138/98 | HR 75 | Temp 98.6°F | Resp 18 | Ht 71.0 in | Wt 236.6 lb

## 2020-05-02 DIAGNOSIS — E785 Hyperlipidemia, unspecified: Secondary | ICD-10-CM | POA: Diagnosis not present

## 2020-05-02 DIAGNOSIS — I10 Essential (primary) hypertension: Secondary | ICD-10-CM | POA: Diagnosis not present

## 2020-05-02 DIAGNOSIS — J4520 Mild intermittent asthma, uncomplicated: Secondary | ICD-10-CM

## 2020-05-02 LAB — LIPID PANEL
Cholesterol: 203 mg/dL — ABNORMAL HIGH (ref 0–200)
HDL: 51 mg/dL (ref >39.00)
LDL Cholesterol: 131 mg/dL — ABNORMAL HIGH (ref 0–99)
NonHDL: 152.26
Total CHOL/HDL Ratio: 4
Triglycerides: 107 mg/dL (ref 0.0–149.0)
VLDL: 21.4 mg/dL (ref 0.0–40.0)

## 2020-05-02 LAB — COMPREHENSIVE METABOLIC PANEL
ALT: 26 U/L (ref 0–53)
AST: 23 U/L (ref 0–37)
Albumin: 4.6 g/dL (ref 3.5–5.2)
Alkaline Phosphatase: 63 U/L (ref 39–117)
BUN: 17 mg/dL (ref 6–23)
CO2: 33 mEq/L — ABNORMAL HIGH (ref 19–32)
Calcium: 9.5 mg/dL (ref 8.4–10.5)
Chloride: 104 mEq/L (ref 96–112)
Creatinine, Ser: 1.03 mg/dL (ref 0.40–1.50)
GFR: 93.63 mL/min (ref >60.00)
Glucose, Bld: 92 mg/dL (ref 70–99)
Potassium: 4 mEq/L (ref 3.5–5.1)
Sodium: 142 mEq/L (ref 135–145)
Total Bilirubin: 0.4 mg/dL (ref 0.2–1.2)
Total Protein: 7.2 g/dL (ref 6.0–8.3)

## 2020-05-02 MED ORDER — LOSARTAN POTASSIUM 50 MG PO TABS: 50.0000 mg | ORAL_TABLET | Freq: Every day | ORAL | 2 refills | Status: DC

## 2020-05-02 MED ORDER — QVAR REDIHALER 40 MCG/ACT IN AERB: 2.0000 | INHALATION_SPRAY | Freq: Two times a day (BID) | RESPIRATORY_TRACT | 5 refills | Status: DC

## 2020-05-02 MED ORDER — ALBUTEROL SULFATE HFA 108 (90 BASE) MCG/ACT IN AERS: INHALATION_SPRAY | RESPIRATORY_TRACT | 1 refills | Status: DC

## 2020-05-02 MED ORDER — AMLODIPINE BESYLATE 10 MG PO TABS: 10.0000 mg | ORAL_TABLET | Freq: Every day | ORAL | 1 refills | Status: DC

## 2020-05-02 NOTE — Patient Instructions (Signed)

## 2020-05-02 NOTE — Assessment & Plan Note (Signed)
Poorly controlled will alter medications, encouraged DASH diet, minimize caffeine and obtain adequate sleep. Report concerning symptoms and follow up as directed and as needed Add losartan 50 mg qd con't norvasc Check labs F/u 2-3 weeks

## 2020-05-02 NOTE — Assessment & Plan Note (Signed)
Add qvar 40 mg 2 puffs bid  con't prn albuterol If pt con't to needs daily albuterol he will contact us

## 2020-05-02 NOTE — Assessment & Plan Note (Signed)
Check labs today Encouraged heart healthy diet, increase exercise, avoid trans fats, consider a krill oil cap daily 

## 2020-05-02 NOTE — Progress Notes (Signed)
Patient ID: William Zimmerman, male    DOB: 21-Apr-1985  Age: 35 y.o. MRN: [id number]    Subjective:  Subjective  HPI William Zimmerman presents for f/u bp and asthma---  He did not get the flovent because it was too expensive --- he is using his albuterol several times a day   Review of Systems  Constitutional: Negative for appetite change, diaphoresis, fatigue and unexpected weight change.  Eyes: Negative for pain, redness and visual disturbance.  Respiratory: Negative for cough, chest tightness, shortness of breath and wheezing.   Cardiovascular: Negative for chest pain, palpitations and leg swelling.  Endocrine: Negative for cold intolerance, heat intolerance, polydipsia, polyphagia and polyuria.  Genitourinary: Negative for difficulty urinating, dysuria and frequency.  Neurological: Negative for dizziness, light-headedness, numbness and headaches.    History Past Medical History:  Diagnosis Date  . Asthma   . Seasonal allergies     He has no past surgical history on file.   His family history includes Arthritis in his maternal grandfather and mother; Diabetes in his maternal grandfather and mother; Hypertension in his father and mother.He reports that he has quit smoking. He has never used smokeless tobacco. He reports current alcohol use of about 4.0 standard drinks of alcohol per week. He reports current drug use. Drug: Marijuana.  No current outpatient medications on file prior to visit.   No current facility-administered medications on file prior to visit.     Objective:  Objective  Physical Exam Vitals and nursing note reviewed.  Constitutional:      General: He is sleeping. Vital signs are normal.     Appearance: He is well-developed and well-nourished.  HENT:     Head: Normocephalic and atraumatic.     Mouth/Throat:     Mouth: Oropharynx is clear and moist.  Eyes:     Extraocular Movements: EOM normal.     Pupils: Pupils are equal, round, and reactive to light.   Neck:     Thyroid: No thyromegaly.  Cardiovascular:     Rate and Rhythm: Normal rate and regular rhythm.     Heart sounds: No murmur heard.   Pulmonary:     Effort: Pulmonary effort is normal. No respiratory distress.     Breath sounds: Normal breath sounds. No wheezing or rales.  Chest:     Chest wall: No tenderness.  Musculoskeletal:        General: No tenderness or edema.     Cervical back: Normal range of motion and neck supple.  Skin:    General: Skin is warm and dry.  Neurological:     Mental Status: He is oriented to person, place, and time.  Psychiatric:        Mood and Affect: Mood and affect normal.        Behavior: Behavior normal.        Thought Content: Thought content normal.        Judgment: Judgment normal.    BP (!) 138/98 (BP Location: Right Arm, Patient Position: Sitting, Cuff Size: Large)   Pulse 75   Temp 98.6 F (37 C) (Oral)   Resp 18   Ht 5\' 11"  (1.803 m)   Wt 236 lb 9.6 oz (107.3 kg)   SpO2 98%   BMI 33.00 kg/m  Wt Readings from Last 3 Encounters:  05/02/20 236 lb 9.6 oz (107.3 kg)  02/01/20 228 lb 6.4 oz (103.6 kg)  01/18/20 222 lb 9.6 oz (101 kg)     Lab Results  Component  Value Date   WBC 5.8 07/05/2019   HGB 15.5 07/05/2019   HCT 48.1 07/05/2019   PLT 310 07/05/2019   GLUCOSE 99 07/05/2019   CHOL 204 (H) 05/04/2019   TRIG 166.0 (H) 05/04/2019   HDL 56.20 05/04/2019   LDLCALC 115 (H) 05/04/2019   ALT 35 05/04/2019   AST 26 05/04/2019   NA 139 07/05/2019   K 4.0 07/05/2019   CL 102 07/05/2019   CREATININE 1.07 07/05/2019   BUN 16 07/05/2019   CO2 25 07/05/2019   TSH 0.62 05/04/2019   MICROALBUR 7.4 (H) 04/29/2018    DG Chest 2 View  Result Date: 07/05/2019 CLINICAL DATA:  Chest tightness.  History of asthma EXAM: CHEST - 2 VIEW COMPARISON:  March 29, 2012 FINDINGS: Lungs are clear. Heart size and pulmonary vascularity are normal. No adenopathy. No pneumothorax. No bone lesions. IMPRESSION: Lungs clear.  Cardiac  silhouette within normal limits. Electronically Signed   By: Bretta Bang III M.D.   On: 07/05/2019 08:28     Assessment & Plan:  Plan  I have discontinued Mohmed Morimoto's Flovent HFA. I have also changed his albuterol. Additionally, I am having him start on losartan and Qvar RediHaler. Lastly, I am having him maintain his amLODipine.  Meds ordered this encounter  Medications  . losartan (COZAAR) 50 MG tablet    Sig: Take 1 tablet (50 mg total) by mouth daily.    Dispense:  30 tablet    Refill:  2  . beclomethasone (QVAR REDIHALER) 40 MCG/ACT inhaler    Sig: Inhale 2 puffs into the lungs 2 (two) times daily.    Dispense:  1 each    Refill:  5  . amLODipine (NORVASC) 10 MG tablet    Sig: Take 1 tablet (10 mg total) by mouth daily.    Dispense:  90 tablet    Refill:  1  . albuterol (VENTOLIN HFA) 108 (90 Base) MCG/ACT inhaler    Sig: 2 puffs qid prn    Dispense:  18 g    Refill:  1    Problem List Items Addressed This Visit      Unprioritized   Mild intermittent asthma without complication   Relevant Medications   beclomethasone (QVAR REDIHALER) 40 MCG/ACT inhaler   albuterol (VENTOLIN HFA) 108 (90 Base) MCG/ACT inhaler   Primary hypertension - Primary   Relevant Medications   losartan (COZAAR) 50 MG tablet   amLODipine (NORVASC) 10 MG tablet   Other Relevant Orders   Lipid panel   Comprehensive metabolic panel      Follow-up: Return in about 3 weeks (around 05/23/2020) for hypertension.  Donato Schultz, DO

## 2020-05-09 ENCOUNTER — Encounter: Payer: BC Managed Care – PPO | Admitting: Family Medicine

## 2020-05-16 ENCOUNTER — Encounter: Payer: Self-pay | Admitting: Family Medicine

## 2020-05-16 ENCOUNTER — Ambulatory Visit (INDEPENDENT_AMBULATORY_CARE_PROVIDER_SITE_OTHER): Payer: BC Managed Care – PPO | Admitting: Family Medicine

## 2020-05-16 ENCOUNTER — Other Ambulatory Visit: Payer: Self-pay

## 2020-05-16 VITALS — BP 100/60 | HR 91 | Temp 98.7°F | Resp 18 | Ht 71.0 in | Wt 232.0 lb

## 2020-05-16 DIAGNOSIS — I10 Essential (primary) hypertension: Secondary | ICD-10-CM | POA: Diagnosis not present

## 2020-05-16 DIAGNOSIS — Z1159 Encounter for screening for other viral diseases: Secondary | ICD-10-CM | POA: Diagnosis not present

## 2020-05-16 DIAGNOSIS — E785 Hyperlipidemia, unspecified: Secondary | ICD-10-CM

## 2020-05-16 DIAGNOSIS — J4520 Mild intermittent asthma, uncomplicated: Secondary | ICD-10-CM

## 2020-05-16 DIAGNOSIS — Z0000 Encounter for general adult medical examination without abnormal findings: Secondary | ICD-10-CM

## 2020-05-16 LAB — LIPID PANEL
Cholesterol: 186 mg/dL (ref 0–200)
HDL: 48.4 mg/dL (ref >39.00)
LDL Cholesterol: 102 mg/dL — ABNORMAL HIGH (ref 0–99)
NonHDL: 137.99
Total CHOL/HDL Ratio: 4
Triglycerides: 181 mg/dL — ABNORMAL HIGH (ref 0.0–149.0)
VLDL: 36.2 mg/dL (ref 0.0–40.0)

## 2020-05-16 LAB — COMPREHENSIVE METABOLIC PANEL
ALT: 28 U/L (ref 0–53)
AST: 27 U/L (ref 0–37)
Albumin: 4.6 g/dL (ref 3.5–5.2)
Alkaline Phosphatase: 59 U/L (ref 39–117)
BUN: 15 mg/dL (ref 6–23)
CO2: 27 mEq/L (ref 19–32)
Calcium: 9.3 mg/dL (ref 8.4–10.5)
Chloride: 105 mEq/L (ref 96–112)
Creatinine, Ser: 1.04 mg/dL (ref 0.40–1.50)
GFR: 92.53 mL/min (ref >60.00)
Glucose, Bld: 91 mg/dL (ref 70–99)
Potassium: 4.2 mEq/L (ref 3.5–5.1)
Sodium: 138 mEq/L (ref 135–145)
Total Bilirubin: 0.4 mg/dL (ref 0.2–1.2)
Total Protein: 7.3 g/dL (ref 6.0–8.3)

## 2020-05-16 LAB — MICROALBUMIN / CREATININE URINE RATIO
Creatinine,U: 166.2 mg/dL
Microalb Creat Ratio: 1.3 mg/g (ref 0.0–30.0)
Microalb, Ur: 2.1 mg/dL — ABNORMAL HIGH (ref 0.0–1.9)

## 2020-05-16 LAB — CBC WITH DIFFERENTIAL/PLATELET
Basophils Absolute: 0 10*3/uL (ref 0.0–0.1)
Basophils Relative: 0.9 % (ref 0.0–3.0)
Eosinophils Absolute: 0 10*3/uL (ref 0.0–0.7)
Eosinophils Relative: 1 % (ref 0.0–5.0)
HCT: 43.3 % (ref 39.0–52.0)
Hemoglobin: 14.5 g/dL (ref 13.0–17.0)
Lymphocytes Relative: 31.7 % (ref 12.0–46.0)
Lymphs Abs: 1.5 10*3/uL (ref 0.7–4.0)
MCHC: 33.5 g/dL (ref 30.0–36.0)
MCV: 82.3 fl (ref 78.0–100.0)
Monocytes Absolute: 0.3 10*3/uL (ref 0.1–1.0)
Monocytes Relative: 5.5 % (ref 3.0–12.0)
Neutro Abs: 2.9 10*3/uL (ref 1.4–7.7)
Neutrophils Relative %: 60.9 % (ref 43.0–77.0)
Platelets: 272 10*3/uL (ref 150.0–400.0)
RBC: 5.26 Mil/uL (ref 4.22–5.81)
RDW: 14.2 % (ref 11.5–15.5)
WBC: 4.8 10*3/uL (ref 4.0–10.5)

## 2020-05-16 LAB — TSH: TSH: 0.51 u[IU]/mL (ref 0.35–4.50)

## 2020-05-16 MED ORDER — ALBUTEROL SULFATE HFA 108 (90 BASE) MCG/ACT IN AERS: INHALATION_SPRAY | RESPIRATORY_TRACT | 1 refills | Status: DC

## 2020-05-16 NOTE — Assessment & Plan Note (Signed)
Stable Refill albuterol-- pt lost it at the gym

## 2020-05-16 NOTE — Progress Notes (Signed)
Patient ID: William Zimmerman, male    DOB: [birth year]  Age: 35 y.o. MRN: [id number]    Subjective:  Subjective  HPI William Zimmerman presents for cpe and f/u bp.  Pt has been working out at [employer]  No complaints   Review of Systems  Constitutional: Negative for appetite change, chills, diaphoresis, fatigue, fever and unexpected weight change.  HENT: Negative for congestion and hearing loss.   Eyes: Negative for pain, discharge, redness and visual disturbance.  Respiratory: Negative for cough, chest tightness, shortness of breath and wheezing.   Cardiovascular: Negative for chest pain, palpitations and leg swelling.  Gastrointestinal: Negative for abdominal pain, blood in stool, constipation, diarrhea, nausea and vomiting.  Endocrine: Negative for cold intolerance, heat intolerance, polydipsia, polyphagia and polyuria.  Genitourinary: Negative for difficulty urinating, dysuria, frequency, hematuria and urgency.  Musculoskeletal: Negative for back pain and myalgias.  Skin: Negative for rash.  Allergic/Immunologic: Negative for environmental allergies.  Neurological: Negative for dizziness, weakness, light-headedness, numbness and headaches.  Hematological: Does not bruise/bleed easily.  Psychiatric/Behavioral: Negative for suicidal ideas. The patient is not nervous/anxious.     History Past Medical History:  Diagnosis Date  . Asthma   . Seasonal allergies     He has no past surgical history on file.   His family history includes Arthritis in his maternal grandfather and mother; Diabetes in his maternal grandfather and mother; Hypertension in his father and mother.He reports that he has quit smoking. He has never used smokeless tobacco. He reports current alcohol use of about 4.0 standard drinks of alcohol per week. He reports current drug use. Drug: Marijuana.  Current Outpatient Medications on File Prior to Visit  Medication Sig Dispense Refill  . amLODipine (NORVASC) 10 MG tablet  Take 1 tablet (10 mg total) by mouth daily. 90 tablet 1  . beclomethasone (QVAR REDIHALER) 40 MCG/ACT inhaler Inhale 2 puffs into the lungs 2 (two) times daily. 1 each 5  . losartan (COZAAR) 50 MG tablet Take 1 tablet (50 mg total) by mouth daily. 30 tablet 2   No current facility-administered medications on file prior to visit.      Health Maintenance  Topic Date Due  . Hepatitis C Screening  Never done  . COVID-19 Vaccine (3 - Booster for Pfizer series) 06/01/2020 (Originally 01/12/2020)  . INFLUENZA VACCINE  06/30/2020 (Originally 11/01/2019)  . HIV Screening  05/16/2021 (Originally 02/04/1999)  . TETANUS/TDAP  02/17/2025   Objective:  Objective  Physical Exam Vitals and nursing note reviewed.  Constitutional:      General: He is not in acute distress.    Appearance: He is well-developed and well-nourished. He is not diaphoretic.  HENT:     Head: Normocephalic and atraumatic.     Right Ear: External ear normal.     Left Ear: External ear normal.     Nose: Nose normal.     Mouth/Throat:     Mouth: Oropharynx is clear and moist.     Pharynx: No oropharyngeal exudate.  Eyes:     General:        Right eye: No discharge.        Left eye: No discharge.     Extraocular Movements: EOM normal.     Conjunctiva/sclera: Conjunctivae normal.     Pupils: Pupils are equal, round, and reactive to light.  Neck:     Thyroid: No thyromegaly.     Vascular: No JVD.  Cardiovascular:     Rate and Rhythm: Normal rate and regular  rhythm.     Pulses: Intact distal pulses.     Heart sounds: No murmur heard.   Pulmonary:     Effort: Pulmonary effort is normal. No respiratory distress.     Breath sounds: Normal breath sounds. No wheezing or rales.  Chest:     Chest wall: No tenderness.  Abdominal:     General: Bowel sounds are normal. There is no distension.     Palpations: Abdomen is soft. There is no mass.     Tenderness: There is no abdominal tenderness. There is no guarding or rebound.   Musculoskeletal:        General: No tenderness or edema. Normal range of motion.     Cervical back: Normal range of motion and neck supple.  Lymphadenopathy:     Cervical: No cervical adenopathy.  Skin:    General: Skin is warm and dry.     Findings: No erythema or rash.  Neurological:     Mental Status: He is alert and oriented to person, place, and time.     Cranial Nerves: No cranial nerve deficit.     Motor: No abnormal muscle tone.     Deep Tendon Reflexes: Reflexes are normal and symmetric. Reflexes normal.  Psychiatric:        Mood and Affect: Mood and affect normal.        Behavior: Behavior normal.        Thought Content: Thought content normal.        Judgment: Judgment normal.    BP 100/60 (BP Location: Right Arm, Patient Position: Sitting, Cuff Size: Large)   Pulse 91   Temp 98.7 F (37.1 C) (Oral)   Resp 18   Ht 5\' 11"  (1.803 m)   Wt 232 lb (105.2 kg)   SpO2 96%   BMI 32.36 kg/m  Wt Readings from Last 3 Encounters:  05/16/20 232 lb (105.2 kg)  05/02/20 236 lb 9.6 oz (107.3 kg)  02/01/20 228 lb 6.4 oz (103.6 kg)     Lab Results  Component Value Date   WBC 5.8 07/05/2019   HGB 15.5 07/05/2019   HCT 48.1 07/05/2019   PLT 310 07/05/2019   GLUCOSE 92 05/02/2020   CHOL 203 (H) 05/02/2020   TRIG 107.0 05/02/2020   HDL 51.00 05/02/2020   LDLCALC 131 (H) 05/02/2020   ALT 26 05/02/2020   AST 23 05/02/2020   NA 142 05/02/2020   K 4.0 05/02/2020   CL 104 05/02/2020   CREATININE 1.03 05/02/2020   BUN 17 05/02/2020   CO2 33 (H) 05/02/2020   TSH 0.62 05/04/2019   MICROALBUR 7.4 (H) 04/29/2018    DG Chest 2 View  Result Date: 07/05/2019 CLINICAL DATA:  Chest tightness.  History of asthma EXAM: CHEST - 2 VIEW COMPARISON:  March 29, 2012 FINDINGS: Lungs are clear. Heart size and pulmonary vascularity are normal. No adenopathy. No pneumothorax. No bone lesions. IMPRESSION: Lungs clear.  Cardiac silhouette within normal limits. Electronically Signed   By:  March 31, 2012 III M.D.   On: 07/05/2019 08:28     Assessment & Plan:  Plan  I am having 09/04/2019 maintain his losartan, Qvar RediHaler, amLODipine, and albuterol.  Meds ordered this encounter  Medications  . albuterol (VENTOLIN HFA) 108 (90 Base) MCG/ACT inhaler    Sig: 2 puffs qid prn    Dispense:  18 g    Refill:  1    Problem List Items Addressed This Visit  Unprioritized   Hyperlipidemia LDL goal <100    Encouraged heart healthy diet, increase exercise, avoid trans fats, consider a krill oil cap daily      Mild intermittent asthma without complication    Stable Refill albuterol-- pt lost it at the gym      Relevant Medications   albuterol (VENTOLIN HFA) 108 (90 Base) MCG/ACT inhaler   Preventative health care - Primary    ghm utd Check labs  See avs       Relevant Orders   TSH   Lipid panel   CBC with Differential/Platelet   Comprehensive metabolic panel   Primary hypertension    Well controlled, no changes to meds. Encouraged heart healthy diet such as the DASH diet and exercise as tolerated.       Relevant Orders   TSH   Lipid panel   CBC with Differential/Platelet   Comprehensive metabolic panel   Microalbumin / creatinine urine ratio    Other Visit Diagnoses    Need for hepatitis C screening test       Relevant Orders   Hepatitis C antibody      Follow-up: Return in about 6 months (around 11/13/2020), or if symptoms worsen or fail to improve, for hypertension.  Donato Schultz, DO

## 2020-05-16 NOTE — Patient Instructions (Signed)
Preventive Care 21-37 Years Old, Male Preventive care refers to lifestyle choices and visits with your health care provider that can promote health and wellness. This includes:  A yearly physical exam. This is also called an annual wellness visit.  Regular dental and eye exams.  Immunizations.  Screening for certain conditions.  Healthy lifestyle choices, such as: ? Eating a healthy diet. ? Getting regular exercise. ? Not using drugs or products that contain nicotine and tobacco. ? Limiting alcohol use. What can I expect for my preventive care visit? Physical exam Your health care provider may check your:  Height and weight. These may be used to calculate your BMI (body mass index). BMI is a measurement that tells if you are at a healthy weight.  Heart rate and blood pressure.  Body temperature.  Skin for abnormal spots. Counseling Your health care provider may ask you questions about your:  Past medical problems.  Family's medical history.  Alcohol, tobacco, and drug use.  Emotional well-being.  Home life and relationship well-being.  Sexual activity.  Diet, exercise, and sleep habits.  Work and work environment.  Access to firearms. What immunizations do I need? Vaccines are usually given at various ages, according to a schedule. Your health care provider will recommend vaccines for you based on your age, medical history, and lifestyle or other factors, such as travel or where you work.   What tests do I need? Blood tests  Lipid and cholesterol levels. These may be checked every 5 years starting at age 20.  Hepatitis C test.  Hepatitis B test. Screening  Diabetes screening. This is done by checking your blood sugar (glucose) after you have not eaten for a while (fasting).  Genital exam to check for testicular cancer or hernias.  STD (sexually transmitted disease) testing, if you are at risk. Talk with your health care provider about your test results,  treatment options, and if necessary, the need for more tests.   Follow these instructions at home: Eating and drinking  Eat a healthy diet that includes fresh fruits and vegetables, whole grains, lean protein, and low-fat dairy products.  Drink enough fluid to keep your urine pale yellow.  Take vitamin and mineral supplements as recommended by your health care provider.  Do not drink alcohol if your health care provider tells you not to drink.  If you drink alcohol: ? Limit how much you have to 0-2 drinks a day. ? Be aware of how much alcohol is in your drink. In the [place] one drink equals one 12 oz bottle of beer (355 mL), one 5 oz glass of wine (148 mL), or one 1 oz glass of hard liquor (44 mL).   Lifestyle  Take daily care of your teeth and gums. Brush your teeth every morning and night with fluoride toothpaste. Floss one time each day.  Stay active. Exercise for at least 30 minutes 5 or more days each week.  Do not use any products that contain nicotine or tobacco, such as cigarettes, e-cigarettes, and chewing tobacco. If you need help quitting, ask your health care provider.  Do not use drugs.  If you are sexually active, practice safe sex. Use a condom or other form of protection to prevent STIs (sexually transmitted infections).  Find healthy ways to cope with stress, such as: ? Meditation, yoga, or listening to music. ? Journaling. ? Talking to a trusted person. ? Spending time with friends and family. Safety  Always wear your seat belt while driving   or riding in a vehicle.  Do not drive: ? If you have been drinking alcohol. Do not ride with someone who has been drinking. ? When you are tired or distracted. ? While texting.  Wear a helmet and other protective equipment during sports activities.  If you have firearms in your house, make sure you follow all gun safety procedures.  Seek help if you have been physically or sexually abused. What's next?  Go to your  health care provider once a year for an annual wellness visit.  Ask your health care provider how often you should have your eyes and teeth checked.  Stay up to date on all vaccines. This information is not intended to replace advice given to you by your health care provider. Make sure you discuss any questions you have with your health care provider. Document Revised: 12/03/2018 Document Reviewed: 03/13/2018 Elsevier Patient Education  2021 [employer] Inc.  

## 2020-05-16 NOTE — Assessment & Plan Note (Signed)
ghm utd Check labs  See avs  

## 2020-05-16 NOTE — Assessment & Plan Note (Signed)
Encouraged heart healthy diet, increase exercise, avoid trans fats, consider a krill oil cap daily 

## 2020-05-16 NOTE — Assessment & Plan Note (Signed)
Well controlled, no changes to meds. Encouraged heart healthy diet such as the DASH diet and exercise as tolerated.  °

## 2020-05-17 LAB — HEPATITIS C ANTIBODY
Hepatitis C Ab: NONREACTIVE
SIGNAL TO CUT-OFF: 0.01 (ref <1.00)

## 2020-07-08 ENCOUNTER — Other Ambulatory Visit: Payer: Self-pay | Admitting: Family Medicine

## 2020-07-08 DIAGNOSIS — J4520 Mild intermittent asthma, uncomplicated: Secondary | ICD-10-CM

## 2020-07-13 ENCOUNTER — Other Ambulatory Visit: Payer: Self-pay

## 2020-07-13 ENCOUNTER — Telehealth: Payer: Self-pay | Admitting: Family Medicine

## 2020-07-13 MED ORDER — ALBUTEROL SULFATE HFA 108 (90 BASE) MCG/ACT IN AERS: 2.0000 | INHALATION_SPRAY | Freq: Four times a day (QID) | RESPIRATORY_TRACT | 2 refills | Status: DC | PRN

## 2020-07-13 NOTE — Telephone Encounter (Signed)
yes

## 2020-07-13 NOTE — Telephone Encounter (Signed)
Okay to change? 

## 2020-07-13 NOTE — Telephone Encounter (Signed)
This has been sent over 

## 2020-07-13 NOTE — Telephone Encounter (Signed)
Patient states his insurance no longer cover albuterol inhaler. Patient states they will cover Pro-air inhaler    [id number] [place], [place] - [address] Phone:  [phone]  Fax:  [phone]

## 2020-07-24 ENCOUNTER — Encounter (HOSPITAL_COMMUNITY): Payer: Self-pay | Admitting: Emergency Medicine

## 2020-07-24 ENCOUNTER — Emergency Department (HOSPITAL_COMMUNITY)
Admission: EM | Admit: 2020-07-24 | Discharge: 2020-07-24 | Disposition: A | Discharge status: Home / Self Care | Payer: BC Managed Care – PPO | Attending: Emergency Medicine | Admitting: Emergency Medicine

## 2020-07-24 ENCOUNTER — Other Ambulatory Visit: Payer: Self-pay

## 2020-07-24 DIAGNOSIS — J4541 Moderate persistent asthma with (acute) exacerbation: Secondary | ICD-10-CM | POA: Insufficient documentation

## 2020-07-24 DIAGNOSIS — Z79899 Other long term (current) drug therapy: Secondary | ICD-10-CM | POA: Diagnosis not present

## 2020-07-24 DIAGNOSIS — J45901 Unspecified asthma with (acute) exacerbation: Secondary | ICD-10-CM

## 2020-07-24 DIAGNOSIS — R0602 Shortness of breath: Secondary | ICD-10-CM | POA: Diagnosis present

## 2020-07-24 DIAGNOSIS — I10 Essential (primary) hypertension: Secondary | ICD-10-CM | POA: Diagnosis not present

## 2020-07-24 DIAGNOSIS — Z87891 Personal history of nicotine dependence: Secondary | ICD-10-CM | POA: Insufficient documentation

## 2020-07-24 MED ORDER — PREDNISONE 20 MG PO TABS: 20.0000 mg | ORAL_TABLET | Freq: Every day | ORAL | 0 refills | Status: AC

## 2020-07-24 MED ORDER — ALBUTEROL SULFATE (2.5 MG/3ML) 0.083% IN NEBU
2.5000 mg | INHALATION_SOLUTION | Freq: Once | RESPIRATORY_TRACT | Status: AC
  Administered 2020-07-24: 2.5 mg via RESPIRATORY_TRACT
  Filled 2020-07-24: qty 3

## 2020-07-24 MED ORDER — IPRATROPIUM-ALBUTEROL 0.5-2.5 (3) MG/3ML IN SOLN
3.0000 mL | Freq: Once | RESPIRATORY_TRACT | Status: AC
  Administered 2020-07-24: 3 mL via RESPIRATORY_TRACT
  Filled 2020-07-24: qty 3

## 2020-07-24 MED ORDER — PREDNISONE 20 MG PO TABS
60.0000 mg | ORAL_TABLET | Freq: Once | ORAL | Status: AC
  Administered 2020-07-24: 60 mg via ORAL
  Filled 2020-07-24: qty 3

## 2020-07-24 NOTE — ED Notes (Signed)
2nd treatment given

## 2020-07-24 NOTE — ED Triage Notes (Signed)
Reports asthma flare-up since yesterday.  Using inhaler without relief.

## 2020-07-24 NOTE — Discharge Instructions (Signed)
Call your primary care doctor or specialist as discussed in the next 2-3 days.   Return immediately back to the ER if:  Your symptoms worsen within the next 12-24 hours. You develop new symptoms such as new fevers, persistent vomiting, new pain, shortness of breath, or new weakness or numbness, or if you have any other concerns.  

## 2020-07-24 NOTE — ED Notes (Signed)
He has allergies  More difficulty with the pollen for the past 2-3 days  No distress

## 2020-07-24 NOTE — ED Provider Notes (Addendum)
[facility] EMERGENCY DEPARTMENT Provider Note   CSN: [id number] Arrival date & time: 07/24/20  1604     History Chief Complaint  Patient presents with  . Asthma    William Zimmerman is a 35 y.o. male.  Patient complaining of chest tightness shortness of breath wheezing ongoing since yesterday.  He states he has a history of asthma and feels like his asthma is getting worse in the last 2 days.  Typically states he has 1 or 2 episodes a year where he lives up in the ER.  Denies chest pain denies fevers denies cough denies vomiting or diarrhea.  He thinks it was made worse when he was out cutting the grass a few days ago.        Past Medical History:  Diagnosis Date  . Asthma   . Seasonal allergies     Patient Active Problem List   Diagnosis Date Noted  . Primary hypertension 01/07/2020  . Mild intermittent asthma without complication 07/07/2018  . 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 Hyperlipidemia LDL goal <100 08/12/2017  . GERD (gastroesophageal reflux disease) 04/22/2017  . Preventative health care 02/19/2015  . Uncontrolled persistent asthma 04/01/2013    History reviewed. No pertinent surgical history.     Family History  Problem Relation Age of Onset  . Diabetes Mother   . Arthritis Mother   . Hypertension Mother   . Hypertension Father   . Diabetes Maternal Grandfather   . Arthritis Maternal Grandfather     Social History   Tobacco Use  . Smoking status: Former Games developer  . Smokeless tobacco: Never Used  Substance Use Topics  . Alcohol use: Yes    Alcohol/week: 4.0 standard drinks    Types: 4 Cans of beer per week    Comment: uses on the weekends only  . Drug use: Yes    Types: Marijuana    Home Medications Prior to Admission medications   Medication Sig Start Date End Date Taking? Authorizing Provider  albuterol (PROAIR HFA) 108 (90 Base) MCG/ACT inhaler Inhale 2 puffs into the lungs every 6  (six) hours as needed for wheezing or shortness of breath. 07/13/20  Yes Seabron Spates R, DO  amLODipine (NORVASC) 10 MG tablet Take 1 tablet (10 mg total) by mouth daily. 05/02/20  Yes Seabron Spates R, DO  beclomethasone (QVAR REDIHALER) 40 MCG/ACT inhaler Inhale 2 puffs into the lungs 2 (two) times daily. 05/02/20  Yes Seabron Spates R, DO  losartan (COZAAR) 50 MG tablet Take 1 tablet (50 mg total) by mouth daily. 05/02/20  Yes Seabron Spates R, DO  predniSONE (DELTASONE) 20 MG tablet Take 1 tablet (20 mg total) by mouth daily for 5 days. 07/24/20 07/29/20 Yes Cheryll Cockayne, MD    Allergies    Patient has no known allergies.  Review of Systems   Review of Systems  Constitutional: Negative for fever.  HENT: Negative for ear pain and sore throat.   Eyes: Negative for pain.  Respiratory: Positive for shortness of breath and wheezing. Negative for cough.   Cardiovascular: Negative for chest pain.  Gastrointestinal: Negative for abdominal pain.  Genitourinary: Negative for flank pain.  Musculoskeletal: Negative for back pain.  Skin: Negative for color change and rash.  Neurological: Negative for syncope.  All other systems reviewed and are negative.   Physical Exam Updated Vital Signs BP 137/70   Pulse 87   Temp 99.4 F (37.4 C) (Oral)  Resp (!) 21   SpO2 94%   Physical Exam Constitutional:      General: He is not in acute distress.    Appearance: He is well-developed.  HENT:     Head: Normocephalic.     Nose: Nose normal.  Eyes:     Extraocular Movements: Extraocular movements intact.  Cardiovascular:     Rate and Rhythm: Normal rate.  Pulmonary:     Effort: Respiratory distress present.     Breath sounds: Wheezing present.     Comments: Patient has diminished air movement on exam.  He has accessory muscle use and shoulder retractions noted on respiratory exam. Skin:    Coloration: Skin is not jaundiced.  Neurological:     Mental Status: He is  alert. Mental status is at baseline.     ED Results / Procedures / Treatments   Labs (all labs ordered are listed, but only abnormal results are displayed) Labs Reviewed - No data to display  EKG None  Radiology No results found.  Procedures .Critical Care E&M Performed by: Cheryll Cockayne, MD  Critical care provider statement:    Critical care time (minutes):  30   Critical care time was exclusive of:  Separately billable procedures and treating other patients   Critical care was necessary to treat or prevent imminent or life-threatening deterioration of the following conditions:  Respiratory failure After initial E/M assessment, critical care services were subsequently performed that were exclusive of separately billable procedures or treatment.   Comments:     Patient requiring 3 breathing treatments back-to-back.     Medications Ordered in ED Medications  ipratropium-albuterol (DUONEB) 0.5-2.5 (3) MG/3ML nebulizer solution 3 mL (3 mLs Nebulization Given 07/24/20 1721)  albuterol (PROVENTIL) (2.5 MG/3ML) 0.083% nebulizer solution 2.5 mg (2.5 mg Nebulization Given 07/24/20 1722)  predniSONE (DELTASONE) tablet 60 mg (60 mg Oral Given 07/24/20 1718)  albuterol (PROVENTIL) (2.5 MG/3ML) 0.083% nebulizer solution 2.5 mg (2.5 mg Nebulization Given 07/24/20 1939)    ED Course  I have reviewed the triage vital signs and the nursing notes.  Pertinent labs & imaging results that were available during my care of the patient were reviewed by me and considered in my medical decision making (see chart for details).    MDM Rules/Calculators/A&P                          Patient given albuterol treatments x3 with subsequent improvement.  Given oral steroids as well.  Given a prescription of steroids to go home with.  Advise follow-up with his primary care doctor in 3 to 4 days, advised immediate return for recurrent trouble breathing worsening symptoms or any additional concerns.  Final  Clinical Impression(s) / ED Diagnoses Final diagnoses:  Moderate asthma with exacerbation, unspecified whether persistent    Rx / DC Orders ED Discharge Orders         Ordered    predniSONE (DELTASONE) 20 MG tablet  Daily        07/24/20 2014           Cheryll Cockayne, MD 07/24/20 2014    Cheryll Cockayne, MD 07/24/20 2015

## 2020-08-05 ENCOUNTER — Other Ambulatory Visit: Payer: Self-pay | Admitting: Family Medicine

## 2020-08-05 ENCOUNTER — Other Ambulatory Visit: Payer: Self-pay

## 2020-08-05 ENCOUNTER — Telehealth: Payer: Self-pay | Admitting: Family Medicine

## 2020-08-05 DIAGNOSIS — I10 Essential (primary) hypertension: Secondary | ICD-10-CM

## 2020-08-05 MED ORDER — ALBUTEROL SULFATE HFA 108 (90 BASE) MCG/ACT IN AERS: 2.0000 | INHALATION_SPRAY | Freq: Four times a day (QID) | RESPIRATORY_TRACT | 2 refills | Status: DC | PRN

## 2020-08-05 NOTE — Telephone Encounter (Signed)
Medication: albuterol (PROAIR HFA) 108 (90 Base) MCG/ACT inhaler    Has the patient contacted their pharmacy? YES (If no, request that the patient contact the pharmacy for the refill.) (If yes, when and what did the pharmacy advise?)  Preferred Pharmacy (with phone number or street name):   [facility] [phone] [place], [place] - [address]  [address], [place] [address]  Phone:  [phone] Fax:  [phone]  DEA #:  [id number]  DAW Reason: --       Agent: Please be advised that RX refills may take up to 3 business days. We ask that you follow-up with your pharmacy.

## 2020-08-05 NOTE — Telephone Encounter (Signed)
Refill sent.

## 2020-08-08 ENCOUNTER — Other Ambulatory Visit: Payer: Self-pay

## 2020-08-08 ENCOUNTER — Emergency Department (HOSPITAL_COMMUNITY)
Admission: EM | Admit: 2020-08-08 | Discharge: 2020-08-08 | Disposition: A | Discharge status: Home / Self Care | Payer: BC Managed Care – PPO | Attending: Emergency Medicine | Admitting: Emergency Medicine

## 2020-08-08 DIAGNOSIS — Z87891 Personal history of nicotine dependence: Secondary | ICD-10-CM | POA: Insufficient documentation

## 2020-08-08 DIAGNOSIS — Z79899 Other long term (current) drug therapy: Secondary | ICD-10-CM | POA: Insufficient documentation

## 2020-08-08 DIAGNOSIS — Z7951 Long term (current) use of inhaled steroids: Secondary | ICD-10-CM | POA: Insufficient documentation

## 2020-08-08 DIAGNOSIS — M79605 Pain in left leg: Secondary | ICD-10-CM | POA: Insufficient documentation

## 2020-08-08 DIAGNOSIS — R0602 Shortness of breath: Secondary | ICD-10-CM | POA: Diagnosis present

## 2020-08-08 DIAGNOSIS — R1030 Lower abdominal pain, unspecified: Secondary | ICD-10-CM | POA: Insufficient documentation

## 2020-08-08 DIAGNOSIS — J4521 Mild intermittent asthma with (acute) exacerbation: Secondary | ICD-10-CM | POA: Insufficient documentation

## 2020-08-08 DIAGNOSIS — I10 Essential (primary) hypertension: Secondary | ICD-10-CM | POA: Diagnosis not present

## 2020-08-08 MED ORDER — IPRATROPIUM-ALBUTEROL 0.5-2.5 (3) MG/3ML IN SOLN
3.0000 mL | RESPIRATORY_TRACT | Status: DC | PRN
  Administered 2020-08-08 (×2): 3 mL via RESPIRATORY_TRACT
  Filled 2020-08-08 (×2): qty 3

## 2020-08-08 MED ORDER — PREDNISONE 20 MG PO TABS: 40.0000 mg | ORAL_TABLET | Freq: Every day | ORAL | 0 refills | Status: DC

## 2020-08-08 MED ORDER — PREDNISONE 20 MG PO TABS
40.0000 mg | ORAL_TABLET | Freq: Once | ORAL | Status: AC
  Administered 2020-08-08: 40 mg via ORAL
  Filled 2020-08-08: qty 2

## 2020-08-08 NOTE — ED Notes (Signed)
Pt verbalizes understanding of discharge instructions. Opportunity for questions and answers were provided. Armband removed by staff, pt discharged from the ED.  

## 2020-08-08 NOTE — ED Triage Notes (Signed)
Pt presents to the ED for an asthma attack and chest tightness. Thinks he smoked too much on Saturday. Pt also would like to have his groin swelling evaluated. Hx of asthma.

## 2020-08-08 NOTE — ED Provider Notes (Signed)
[facility] EMERGENCY DEPARTMENT Provider Note   CSN: [id number] Arrival date & time: 08/08/20  0825     History No chief complaint on file.   William Zimmerman is a 35 y.o. male.  35 year old male with past medical history below including asthma, hypertension, seasonal allergies who presents with asthma attack.  3 days ago, patient went to [facility] for dinner and feels like he inhaled too much smoke, causing his asthma to flare. He reports dry cough and chest tightness/SOB c/w usual asthma exacerbation. He last used albuterol around 6am. He reports compliance with his controller inhalers.  He reports mild seasonal allergy symptoms. No fever or sick contacts.   PT also reports several days of groin pain behind his scrotum going down L leg. He doesn't feel it on the right side. He did do squats/lifting recently. No scrotal swelling, testicular pain, or skin changes.  The history is provided by the patient.       Past Medical History:  Diagnosis Date  . Asthma   . Seasonal allergies     Patient Active Problem List   Diagnosis Date Noted  . Primary hypertension 01/07/2020  . Mild intermittent asthma without complication 07/07/2018  . 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 Hyperlipidemia LDL goal <100 08/12/2017  . GERD (gastroesophageal reflux disease) 04/22/2017  . Preventative health care 02/19/2015  . Uncontrolled persistent asthma 04/01/2013    No past surgical history on file.     Family History  Problem Relation Age of Onset  . Diabetes Mother   . Arthritis Mother   . Hypertension Mother   . Hypertension Father   . Diabetes Maternal Grandfather   . Arthritis Maternal Grandfather     Social History   Tobacco Use  . Smoking status: Former Games developer  . Smokeless tobacco: Never Used  Substance Use Topics  . Alcohol use: Yes    Alcohol/week: 4.0 standard drinks    Types: 4 Cans of beer per week    Comment: uses on  the weekends only  . Drug use: Yes    Types: Marijuana    Home Medications Prior to Admission medications   Medication Sig Start Date End Date Taking? Authorizing Provider  predniSONE (DELTASONE) 20 MG tablet Take 2 tablets (40 mg total) by mouth daily. 08/08/20  Yes Axel Meas, Ambrose Finland, MD  albuterol [facility] HFA) 108 (90 Base) MCG/ACT inhaler Inhale 2 puffs into the lungs every 6 (six) hours as needed for wheezing or shortness of breath. 08/05/20   Seabron Spates R, DO  amLODipine (NORVASC) 10 MG tablet Take 1 tablet (10 mg total) by mouth daily. 05/02/20   Donato Schultz, DO  beclomethasone (QVAR REDIHALER) 40 MCG/ACT inhaler Inhale 2 puffs into the lungs 2 (two) times daily. 05/02/20   Donato Schultz, DO  losartan (COZAAR) 50 MG tablet TAKE 1 TABLET(50 MG) BY MOUTH DAILY 08/05/20   Donato Schultz, DO    Allergies    Patient has no known allergies.  Review of Systems   Review of Systems All other systems reviewed and are negative except that which was mentioned in HPI  Physical Exam Updated Vital Signs BP (!) 128/97   Pulse 73   Temp 98.5 F (36.9 C) (Oral)   Resp 11   SpO2 98%   Physical Exam Vitals and nursing note reviewed. Exam conducted with a chaperone present.  Constitutional:      General: He is not in acute  distress.    Appearance: Normal appearance.  HENT:     Head: Normocephalic and atraumatic.  Eyes:     Conjunctiva/sclera: Conjunctivae normal.  Cardiovascular:     Rate and Rhythm: Normal rate and regular rhythm.     Heart sounds: Normal heart sounds. No murmur heard.   Pulmonary:     Comments: Mildly increased work of breathing without respiratory distress, diminished breath sounds bilaterally with faint wheezing in R lung Abdominal:     General: Abdomen is flat. Bowel sounds are normal. There is no distension.     Palpations: Abdomen is soft.     Tenderness: There is no abdominal tenderness.  Genitourinary:    Penis: Normal.       Testes: Normal.     Comments: No scrotal swelling, skin changes, crepitus, or focal tenderness Musculoskeletal:     Right lower leg: No edema.     Left lower leg: No edema.  Skin:    General: Skin is warm and dry.  Neurological:     Mental Status: He is alert and oriented to person, place, and time.     Comments: fluent  Psychiatric:        Mood and Affect: Mood normal.        Behavior: Behavior normal.     ED Results / Procedures / Treatments   Labs (all labs ordered are listed, but only abnormal results are displayed) Labs Reviewed - No data to display  EKG None  Radiology No results found.  Procedures Procedures   Medications Ordered in ED Medications  ipratropium-albuterol (DUONEB) 0.5-2.5 (3) MG/3ML nebulizer solution 3 mL (3 mLs Nebulization Given 08/08/20 1107)  predniSONE (DELTASONE) tablet 40 mg (40 mg Oral Given 08/08/20 3976)    ED Course  I have reviewed the triage vital signs and the nursing notes.  Pertinent labs & imaging results that were available during my care of the patient were reviewed by me and considered in my medical decision making (see chart for details).    MDM Rules/Calculators/A&P                          No respiratory distress on exam, stable vital signs.  He did have diminished breath sounds in occasional wheezes.  Gave DuoNeb's and will start on steroid burst.  Also recommended taking daily Zyrtec to help control symptoms.  Regarding his groin pain, he has no skin changes or swelling to suggest infection, no testicular pain or scrotal swelling to suggest testicular problem.  Pain seems to go down inner thigh consistent with musculoskeletal strain.  Have counseled on supportive measures and recommended PCP follow-up for reassessment after rest and stretching.  I have reviewed return precautions regarding this complaint and he voiced understanding.  Regarding asthma exacerbation, patient improved after 2 breathing treatments here.  Have  started on prednisone and provided with prednisone burst.  Encouraged to follow-up with PCP as he may need adjustments to his controller medications.  Return precautions reviewed. Final Clinical Impression(s) / ED Diagnoses Final diagnoses:  Exacerbation of intermittent asthma, unspecified asthma severity    Rx / DC Orders ED Discharge Orders         Ordered    predniSONE (DELTASONE) 20 MG tablet  Daily        08/08/20 1156           Rashell Shambaugh, Ambrose Finland, MD 08/08/20 1157

## 2020-11-14 ENCOUNTER — Ambulatory Visit: Payer: BC Managed Care – PPO | Admitting: Family Medicine

## 2020-11-21 ENCOUNTER — Encounter: Payer: Self-pay | Admitting: Family Medicine

## 2020-11-21 ENCOUNTER — Ambulatory Visit: Payer: BC Managed Care – PPO | Admitting: Family Medicine

## 2020-11-21 ENCOUNTER — Other Ambulatory Visit: Payer: Self-pay

## 2020-11-21 VITALS — BP 110/70 | HR 79 | Temp 98.7°F | Resp 18 | Ht 71.0 in | Wt 227.2 lb

## 2020-11-21 DIAGNOSIS — J4520 Mild intermittent asthma, uncomplicated: Secondary | ICD-10-CM

## 2020-11-21 DIAGNOSIS — I10 Essential (primary) hypertension: Secondary | ICD-10-CM | POA: Diagnosis not present

## 2020-11-21 DIAGNOSIS — E785 Hyperlipidemia, unspecified: Secondary | ICD-10-CM

## 2020-11-21 DIAGNOSIS — Z889 Allergy status to unspecified drugs, medicaments and biological substances status: Secondary | ICD-10-CM | POA: Diagnosis not present

## 2020-11-21 LAB — COMPREHENSIVE METABOLIC PANEL
ALT: 34 U/L (ref 0–53)
AST: 41 U/L — ABNORMAL HIGH (ref 0–37)
Albumin: 4.3 g/dL (ref 3.5–5.2)
Alkaline Phosphatase: 57 U/L (ref 39–117)
BUN: 12 mg/dL (ref 6–23)
CO2: 29 mEq/L (ref 19–32)
Calcium: 9.6 mg/dL (ref 8.4–10.5)
Chloride: 103 mEq/L (ref 96–112)
Creatinine, Ser: 1.14 mg/dL (ref 0.40–1.50)
GFR: 82.57 mL/min (ref >60.00)
Glucose, Bld: 88 mg/dL (ref 70–99)
Potassium: 4.6 mEq/L (ref 3.5–5.1)
Sodium: 139 mEq/L (ref 135–145)
Total Bilirubin: 0.5 mg/dL (ref 0.2–1.2)
Total Protein: 7.2 g/dL (ref 6.0–8.3)

## 2020-11-21 LAB — LIPID PANEL
Cholesterol: 208 mg/dL — ABNORMAL HIGH (ref 0–200)
HDL: 85.3 mg/dL (ref >39.00)
LDL Cholesterol: 109 mg/dL — ABNORMAL HIGH (ref 0–99)
NonHDL: 122.8
Total CHOL/HDL Ratio: 2
Triglycerides: 71 mg/dL (ref 0.0–149.0)
VLDL: 14.2 mg/dL (ref 0.0–40.0)

## 2020-11-21 MED ORDER — AMLODIPINE BESYLATE 10 MG PO TABS: 10.0000 mg | ORAL_TABLET | Freq: Every day | ORAL | 1 refills | Status: DC

## 2020-11-21 MED ORDER — ALBUTEROL SULFATE HFA 108 (90 BASE) MCG/ACT IN AERS
2.0000 | INHALATION_SPRAY | Freq: Four times a day (QID) | RESPIRATORY_TRACT | 2 refills | Status: DC | PRN
Start: 2020-11-21 — End: 2021-03-13

## 2020-11-21 MED ORDER — QVAR REDIHALER 40 MCG/ACT IN AERB: 2.0000 | INHALATION_SPRAY | Freq: Two times a day (BID) | RESPIRATORY_TRACT | 5 refills | Status: DC

## 2020-11-21 MED ORDER — LOSARTAN POTASSIUM 50 MG PO TABS: ORAL_TABLET | ORAL | 2 refills | Status: DC

## 2020-11-21 NOTE — Patient Instructions (Signed)
https://www.nhlbi.nih.gov/files/docs/public/heart/dash_brief.pdf">  DASH Eating Plan DASH stands for Dietary Approaches to Stop Hypertension. The DASH eating plan is a healthy eating plan that has been shown to: Reduce high blood pressure (hypertension). Reduce your risk for type 2 diabetes, heart disease, and stroke. Help with weight loss. What are tips for following this plan? Reading food labels Check food labels for the amount of salt (sodium) per serving. Choose foods with less than 5 percent of the Daily Value of sodium. Generally, foods with less than 300 milligrams (mg) of sodium per serving fit into this eating plan. To find whole grains, look for the word "whole" as the first word in the ingredient list. Shopping Buy products labeled as "low-sodium" or "no salt added." Buy fresh foods. Avoid canned foods and pre-made or frozen meals. Cooking Avoid adding salt when cooking. Use salt-free seasonings or herbs instead of table salt or sea salt. Check with your health care provider or pharmacist before using salt substitutes. Do not fry foods. Cook foods using healthy methods such as baking, boiling, grilling, roasting, and broiling instead. Cook with heart-healthy oils, such as olive, canola, avocado, soybean, or sunflower oil. Meal planning  Eat a balanced diet that includes: 4 or more servings of fruits and 4 or more servings of vegetables each day. Try to fill one-half of your plate with fruits and vegetables. 6-8 servings of whole grains each day. Less than 6 oz (170 g) of lean meat, poultry, or fish each day. A 3-oz (85-g) serving of meat is about the same size as a deck of cards. One egg equals 1 oz (28 g). 2-3 servings of low-fat dairy each day. One serving is 1 cup (237 mL). 1 serving of nuts, seeds, or beans 5 times each week. 2-3 servings of heart-healthy fats. Healthy fats called omega-3 fatty acids are found in foods such as walnuts, flaxseeds, fortified milks, and eggs.  These fats are also found in cold-water fish, such as sardines, salmon, and mackerel. Limit how much you eat of: Canned or prepackaged foods. Food that is high in trans fat, such as some fried foods. Food that is high in saturated fat, such as fatty meat. Desserts and other sweets, sugary drinks, and other foods with added sugar. Full-fat dairy products. Do not salt foods before eating. Do not eat more than 4 egg yolks a week. Try to eat at least 2 vegetarian meals a week. Eat more home-cooked food and less restaurant, buffet, and fast food.  Lifestyle When eating at a restaurant, ask that your food be prepared with less salt or no salt, if possible. If you drink alcohol: Limit how much you use to: 0-1 drink a day for women who are not pregnant. 0-2 drinks a day for men. Be aware of how much alcohol is in your drink. In the [place] one drink equals one 12 oz bottle of beer (355 mL), one 5 oz glass of wine (148 mL), or one 1 oz glass of hard liquor (44 mL). General information Avoid eating more than 2,300 mg of salt a day. If you have hypertension, you may need to reduce your sodium intake to 1,500 mg a day. Work with your health care provider to maintain a healthy body weight or to lose weight. Ask what an ideal weight is for you. Get at least 30 minutes of exercise that causes your heart to beat faster (aerobic exercise) most days of the week. Activities may include walking, swimming, or biking. Work with your health care provider   or dietitian to adjust your eating plan to your individual calorie needs. What foods should I eat? Fruits All fresh, dried, or frozen fruit. Canned fruit in natural juice (without addedsugar). Vegetables Fresh or frozen vegetables (raw, steamed, roasted, or grilled). Low-sodium or reduced-sodium tomato and vegetable juice. Low-sodium or reduced-sodium tomatosauce and tomato paste. Low-sodium or reduced-sodium canned vegetables. Grains Whole-grain or  whole-wheat bread. Whole-grain or whole-wheat pasta. Brown rice. Oatmeal. Quinoa. Bulgur. Whole-grain and low-sodium cereals. Pita bread.Low-fat, low-sodium crackers. Whole-wheat flour tortillas. Meats and other proteins Skinless chicken or turkey. Ground chicken or turkey. Pork with fat trimmed off. Fish and seafood. Egg whites. Dried beans, peas, or lentils. Unsalted nuts, nut butters, and seeds. Unsalted canned beans. Lean cuts of beef with fat trimmed off. Low-sodium, lean precooked or cured meat, such as sausages or meatloaves. Dairy Low-fat (1%) or fat-free (skim) milk. Reduced-fat, low-fat, or fat-free cheeses. Nonfat, low-sodium ricotta or cottage cheese. Low-fat or nonfatyogurt. Low-fat, low-sodium cheese. Fats and oils Soft margarine without trans fats. Vegetable oil. Reduced-fat, low-fat, or light mayonnaise and salad dressings (reduced-sodium). Canola, safflower, olive, avocado, soybean, andsunflower oils. Avocado. Seasonings and condiments Herbs. Spices. Seasoning mixes without salt. Other foods Unsalted popcorn and pretzels. Fat-free sweets. The items listed above may not be a complete list of foods and beverages you can eat. Contact a dietitian for more information. What foods should I avoid? Fruits Canned fruit in a light or heavy syrup. Fried fruit. Fruit in cream or buttersauce. Vegetables Creamed or fried vegetables. Vegetables in a cheese sauce. Regular canned vegetables (not low-sodium or reduced-sodium). Regular canned tomato sauce and paste (not low-sodium or reduced-sodium). Regular tomato and vegetable juice(not low-sodium or reduced-sodium). Pickles. Olives. Grains Baked goods made with fat, such as croissants, muffins, or some breads. Drypasta or rice meal packs. Meats and other proteins Fatty cuts of meat. Ribs. Fried meat. Bacon. Bologna, salami, and other precooked or cured meats, such as sausages or meat loaves. Fat from the back of a pig (fatback). Bratwurst.  Salted nuts and seeds. Canned beans with added salt. Canned orsmoked fish. Whole eggs or egg yolks. Chicken or turkey with skin. Dairy Whole or 2% milk, cream, and half-and-half. Whole or full-fat cream cheese. Whole-fat or sweetened yogurt. Full-fat cheese. Nondairy creamers. Whippedtoppings. Processed cheese and cheese spreads. Fats and oils Butter. Stick margarine. Lard. Shortening. Ghee. Bacon fat. Tropical oils, suchas coconut, palm kernel, or palm oil. Seasonings and condiments Onion salt, garlic salt, seasoned salt, table salt, and sea salt. Worcestershire sauce. Tartar sauce. Barbecue sauce. Teriyaki sauce. Soy sauce, including reduced-sodium. Steak sauce. Canned and packaged gravies. Fish sauce. Oyster sauce. Cocktail sauce. Store-bought horseradish. Ketchup. Mustard. Meat flavorings and tenderizers. Bouillon cubes. Hot sauces. Pre-made or packaged marinades. Pre-made or packaged taco seasonings. Relishes. Regular saladdressings. Other foods Salted popcorn and pretzels. The items listed above may not be a complete list of foods and beverages you should avoid. Contact a dietitian for more information. Where to find more information National Heart, Lung, and Blood Institute: www.nhlbi.nih.gov American Heart Association: www.heart.org Academy of Nutrition and Dietetics: www.eatright.org National Kidney Foundation: www.kidney.org Summary The DASH eating plan is a healthy eating plan that has been shown to reduce high blood pressure (hypertension). It may also reduce your risk for type 2 diabetes, heart disease, and stroke. When on the DASH eating plan, aim to eat more fresh fruits and vegetables, whole grains, lean proteins, low-fat dairy, and heart-healthy fats. With the DASH eating plan, you should limit salt (sodium) intake to 2,300   mg a day. If you have hypertension, you may need to reduce your sodium intake to 1,500 mg a day. Work with your health care provider or dietitian to adjust  your eating plan to your individual calorie needs. This information is not intended to replace advice given to you by your health care provider. Make sure you discuss any questions you have with your healthcare provider. Document Revised: 02/20/2019 Document Reviewed: 02/20/2019 Elsevier Patient Education  2022 Elsevier Inc.  

## 2020-11-21 NOTE — Assessment & Plan Note (Signed)
Well controlled, no changes to meds. Encouraged heart healthy diet such as the DASH diet and exercise as tolerated.  °

## 2020-11-21 NOTE — Assessment & Plan Note (Signed)
Pt again instructed to use qvar daily and albuterol prn

## 2020-11-21 NOTE — Assessment & Plan Note (Signed)
Encourage heart healthy diet such as MIND or DASH diet, increase exercise, avoid trans fats, simple carbohydrates and processed foods, consider a krill or fish or flaxseed oil cap daily.  °

## 2020-11-21 NOTE — Progress Notes (Signed)
Established Patient Office Visit  Subjective:  Patient ID: William Zimmerman, male    DOB: 05/12/1985  Age: 35 y.o. MRN: [id number]  CC:  Chief Complaint  Patient presents with   Hypertension    Pt states checking BP at home and have been okay.   Follow-up    HPI [name] presents for f/u bp.  .  He needs refills of his asthma meds.   He was not taking the qvar regularly and is using the albuterol daily.   He is requesting a referral to allergist  Past Medical History:  Diagnosis Date   Asthma    Seasonal allergies     No past surgical history on file.  Family History  Problem Relation Age of Onset   Diabetes Mother    Arthritis Mother    Hypertension Mother    Hypertension Father    Diabetes Maternal Grandfather    Arthritis Maternal Grandfather     Social History   Socioeconomic History   Marital status: Married    Spouse name: Not on file   Number of children: Not on file   Years of education: Not on file   Highest education level: Not on file  Occupational History   Not on file  Tobacco Use   Smoking status: Former   Smokeless tobacco: Never  Substance and Sexual Activity   Alcohol use: Yes    Alcohol/week: 4.0 standard drinks    Types: 4 Cans of beer per week    Comment: uses on the weekends only   Drug use: Yes    Types: Marijuana   Sexual activity: Not on file  Other Topics Concern   Not on file  Social History Narrative   Not on file   Social Determinants of Health   Financial Resource Strain: Not on file  Food Insecurity: Not on file  Transportation Needs: Not on file  Physical Activity: Not on file  Stress: Not on file  Social Connections: Not on file  Intimate Partner Violence: Not on file    Outpatient Medications Prior to Visit  Medication Sig Dispense Refill   albuterol (PROAIR HFA) 108 (90 Base) MCG/ACT inhaler Inhale 2 puffs into the lungs every 6 (six) hours as needed for wheezing or shortness of breath. 18 g 2    amLODipine (NORVASC) 10 MG tablet Take 1 tablet (10 mg total) by mouth daily. 90 tablet 1   beclomethasone (QVAR REDIHALER) 40 MCG/ACT inhaler Inhale 2 puffs into the lungs 2 (two) times daily. 1 each 5   losartan (COZAAR) 50 MG tablet TAKE 1 TABLET(50 MG) BY MOUTH DAILY 30 tablet 2   predniSONE (DELTASONE) 20 MG tablet Take 2 tablets (40 mg total) by mouth daily. (Patient not taking: Reported on 11/21/2020) 10 tablet 0   No facility-administered medications prior to visit.    No Known Allergies  ROS Review of Systems  Constitutional:  Negative for chills and fever.  HENT:  Negative for congestion and hearing loss.   Eyes:  Negative for discharge.  Respiratory:  Negative for cough and shortness of breath.   Cardiovascular:  Negative for chest pain, palpitations and leg swelling.  Gastrointestinal:  Negative for abdominal pain, blood in stool, constipation, diarrhea, nausea and vomiting.  Genitourinary:  Negative for dysuria, frequency, hematuria and urgency.  Musculoskeletal:  Negative for back pain and myalgias.  Skin:  Negative for rash.  Allergic/Immunologic: Negative for environmental allergies.  Neurological:  Negative for dizziness, weakness and headaches.  Hematological:  Does not bruise/bleed easily.  Psychiatric/Behavioral:  Negative for suicidal ideas. The patient is not nervous/anxious.      Objective:    Physical Exam Vitals and nursing note reviewed.  Constitutional:      Appearance: He is well-developed.  HENT:     Head: Normocephalic and atraumatic.  Eyes:     Pupils: Pupils are equal, round, and reactive to light.  Neck:     Thyroid: No thyromegaly.  Cardiovascular:     Rate and Rhythm: Normal rate and regular rhythm.     Heart sounds: No murmur heard. Pulmonary:     Effort: Pulmonary effort is normal. No respiratory distress.     Breath sounds: Normal breath sounds. No wheezing or rales.  Chest:     Chest wall: No tenderness.  Musculoskeletal:         General: No tenderness.     Cervical back: Normal range of motion and neck supple.  Skin:    General: Skin is warm and dry.  Neurological:     Mental Status: He is alert and oriented to person, place, and time.  Psychiatric:        Behavior: Behavior normal.        Thought Content: Thought content normal.        Judgment: Judgment normal.    BP 110/70 (BP Location: Right Arm, Patient Position: Sitting, Cuff Size: Large)   Pulse 79   Temp 98.7 F (37.1 C) (Oral)   Resp 18   Ht 5\' 11"  (1.803 m)   Wt 227 lb 3.2 oz (103.1 kg)   SpO2 99%   BMI 31.69 kg/m  Wt Readings from Last 3 Encounters:  11/21/20 227 lb 3.2 oz (103.1 kg)  05/16/20 232 lb (105.2 kg)  05/02/20 236 lb 9.6 oz (107.3 kg)     Health Maintenance Due  Topic Date Due   COVID-19 Vaccine (3 - Booster for Pfizer series) 12/13/2019   INFLUENZA VACCINE  10/31/2020    There are no preventive care reminders to display for this patient.  Lab Results  Component Value Date   TSH 0.51 05/16/2020   Lab Results  Component Value Date   WBC 4.8 05/16/2020   HGB 14.5 05/16/2020   HCT 43.3 05/16/2020   MCV 82.3 05/16/2020   PLT 272.0 05/16/2020   Lab Results  Component Value Date   NA 138 05/16/2020   K 4.2 05/16/2020   CO2 27 05/16/2020   GLUCOSE 91 05/16/2020   BUN 15 05/16/2020   CREATININE 1.04 05/16/2020   BILITOT 0.4 05/16/2020   ALKPHOS 59 05/16/2020   AST 27 05/16/2020   ALT 28 05/16/2020   PROT 7.3 05/16/2020   ALBUMIN 4.6 05/16/2020   CALCIUM 9.3 05/16/2020   ANIONGAP 12 07/05/2019   GFR 92.53 05/16/2020   Lab Results  Component Value Date   CHOL 186 05/16/2020   Lab Results  Component Value Date   HDL 48.40 05/16/2020   Lab Results  Component Value Date   LDLCALC 102 (H) 05/16/2020   Lab Results  Component Value Date   TRIG 181.0 (H) 05/16/2020   Lab Results  Component Value Date   CHOLHDL 4 05/16/2020   No results found for: HGBA1C    Assessment & Plan:   Problem List Items  Addressed This Visit       Unprioritized   Hyperlipidemia LDL goal <100    Encourage heart healthy diet such as MIND or DASH diet, increase exercise, avoid trans fats,  simple carbohydrates and processed foods, consider a krill or fish or flaxseed oil cap daily.       Relevant Medications   amLODipine (NORVASC) 10 MG tablet   losartan (COZAAR) 50 MG tablet   Mild intermittent asthma    Pt again instructed to use qvar daily and albuterol prn       Relevant Medications   albuterol (PROAIR HFA) 108 (90 Base) MCG/ACT inhaler   beclomethasone (QVAR REDIHALER) 40 MCG/ACT inhaler   Multiple allergies    Refer to allergy       Relevant Orders   Ambulatory referral to Allergy   Primary hypertension - Primary    Well controlled, no changes to meds. Encouraged heart healthy diet such as the DASH diet and exercise as tolerated.       Relevant Medications   amLODipine (NORVASC) 10 MG tablet   losartan (COZAAR) 50 MG tablet   Other Relevant Orders   Lipid panel   Comprehensive metabolic panel    Meds ordered this encounter  Medications   albuterol (PROAIR HFA) 108 (90 Base) MCG/ACT inhaler    Sig: Inhale 2 puffs into the lungs every 6 (six) hours as needed for wheezing or shortness of breath.    Dispense:  18 g    Refill:  2   amLODipine (NORVASC) 10 MG tablet    Sig: Take 1 tablet (10 mg total) by mouth daily.    Dispense:  90 tablet    Refill:  1   losartan (COZAAR) 50 MG tablet    Sig: TAKE 1 TABLET(50 MG) BY MOUTH DAILY    Dispense:  30 tablet    Refill:  2   beclomethasone (QVAR REDIHALER) 40 MCG/ACT inhaler    Sig: Inhale 2 puffs into the lungs 2 (two) times daily.    Dispense:  1 each    Refill:  5    Follow-up: Return in about 6 months (around 05/24/2021), or if symptoms worsen or fail to improve, for annual exam, fasting.    Donato Schultz, DO

## 2020-11-21 NOTE — Assessment & Plan Note (Signed)
Refer to allergy

## 2020-12-16 ENCOUNTER — Other Ambulatory Visit: Payer: Self-pay | Admitting: Family Medicine

## 2020-12-16 DIAGNOSIS — J4520 Mild intermittent asthma, uncomplicated: Secondary | ICD-10-CM

## 2021-01-26 ENCOUNTER — Encounter: Payer: Self-pay | Admitting: Allergy

## 2021-01-26 ENCOUNTER — Other Ambulatory Visit: Payer: Self-pay

## 2021-01-26 ENCOUNTER — Ambulatory Visit (INDEPENDENT_AMBULATORY_CARE_PROVIDER_SITE_OTHER): Payer: BC Managed Care – PPO | Admitting: Allergy

## 2021-01-26 VITALS — BP 142/80 | HR 97 | Temp 98.4°F | Resp 16 | Ht 71.0 in | Wt 242.2 lb

## 2021-01-26 DIAGNOSIS — L2089 Other atopic dermatitis: Secondary | ICD-10-CM

## 2021-01-26 DIAGNOSIS — T781XXD Other adverse food reactions, not elsewhere classified, subsequent encounter: Secondary | ICD-10-CM | POA: Diagnosis not present

## 2021-01-26 DIAGNOSIS — T7809XD Anaphylactic reaction due to other food products, subsequent encounter: Secondary | ICD-10-CM

## 2021-01-26 DIAGNOSIS — J4540 Moderate persistent asthma, uncomplicated: Secondary | ICD-10-CM

## 2021-01-26 MED ORDER — ARNUITY ELLIPTA 200 MCG/ACT IN AEPB: 1.0000 | INHALATION_SPRAY | Freq: Every day | RESPIRATORY_TRACT | 5 refills | Status: DC

## 2021-01-26 MED ORDER — EPINEPHRINE 0.3 MG/0.3ML IJ SOAJ: 0.3000 mg | Freq: Once | INTRAMUSCULAR | 1 refills | Status: AC

## 2021-01-26 NOTE — Progress Notes (Signed)
New Patient Note  RE: William Zimmerman MRN: [id number] DOB: 11-17-1985 Date of Office Visit: 01/26/2021  Referring provider: Donato Schultz, * Primary care provider: Zola Button, Grayling Congress, DO  Chief Complaint: Food allergy  History of present illness: William Zimmerman is a 35 y.o. male presenting today for consultation for food allergy.  He thinks he has a seafood allergy.  He states when he had seafood as a child (around teenage years) he had a reaction where he developed facial swelling.  He avoids fish and shellfish since that time.    He does reports history of eczema where he can have bumpy rash on his arms.  He applies Eucerin which controls this issue.  He has history of asthma diagnosed in childhood.  He has Qvar that he takes 2 puffs once day.  He does states some days he may miss his Qvar dose.  He uses albuterol almost daily for shortness of breath.  He states about a month He did have to go to the emergency department after he states he had a lot of smoke exposure that triggered his asthma symptoms and was treated with prednisone. There is also an ED visit from May and April of this year for asthma exacerbations.  He does not have history of allergic rhinoconjunctivitis.  Review of systems: Review of Systems  Constitutional: Negative.   HENT: Negative.    Eyes: Negative.   Respiratory: Negative.    Cardiovascular: Negative.   Gastrointestinal: Negative.   Musculoskeletal: Negative.   Skin: Negative.   Neurological: Negative.    All other systems negative unless noted above in HPI  Past medical history: Past Medical History:  Diagnosis Date   Asthma    Eczema    Seasonal allergies     Past surgical history: History reviewed. No pertinent surgical history.  Family history:  Family History  Problem Relation Age of Onset   Diabetes Mother    Arthritis Mother    Hypertension Mother    Hypertension Father    Diabetes Maternal Grandfather     Arthritis Maternal Grandfather     Social history:  Lives in a home without carpeting with electric heating and central cooling.  No pets in the home.  He is a [employer].  There is no concern for water damage, mildew or roaches in the home.  He currently denies any smoking history.  Medication List: Current Outpatient Medications  Medication Sig Dispense Refill   albuterol (PROAIR HFA) 108 (90 Base) MCG/ACT inhaler Inhale 2 puffs into the lungs every 6 (six) hours as needed for wheezing or shortness of breath. 18 g 2   amLODipine (NORVASC) 10 MG tablet Take 1 tablet (10 mg total) by mouth daily. 90 tablet 1   EPINEPHrine (AUVI-Q) 0.3 mg/0.3 mL IJ SOAJ injection Inject 0.3 mg into the muscle once for 1 dose. As directed for life-threatening allergic reactions 2 each 1   Fluticasone Furoate (ARNUITY ELLIPTA) 200 MCG/ACT AEPB Inhale 1 puff into the lungs daily. 30 each 5   losartan (COZAAR) 50 MG tablet TAKE 1 TABLET(50 MG) BY MOUTH DAILY 30 tablet 2   QVAR REDIHALER 40 MCG/ACT inhaler INHALE 2 PUFFS INTO THE LUNGS TWICE A DAY 30 g 2   No current facility-administered medications for this visit.    Known medication allergies: No Known Allergies   Physical examination: Blood pressure (!) 142/80, pulse 97, temperature 98.4 F (36.9 C), temperature source Temporal, resp. rate 16, height 5\' 11"  (1.803  m), weight 242 lb 3.2 oz (109.9 kg), SpO2 97 %.  General: Alert, interactive, in no acute distress. HEENT: PERRLA, TMs pearly gray, turbinates non-edematous without discharge, post-pharynx non erythematous. Neck: Supple without lymphadenopathy. Lungs: Clear to auscultation without wheezing, rhonchi or rales. {no increased work of breathing. CV: Normal S1, S2 without murmurs. Abdomen: Nondistended, nontender. Skin: Warm and dry, without lesions or rashes. Extremities:  No clubbing, cyanosis or edema. Neuro:   Grossly intact.  Diagnositics/Labs: Albuterol taken prior to visit about 2  hours ago. Spirometry: FEV1: 1.61 L 43%, FVC: 2.44 L 53% predicted.  Status post Xopenex he had a 27% increase in FEV1 to 2.05L 54% which is a significant response.  However lung function is still low.  Allergy testing: Skin prick testing to fish and shellfish panel is positive to Trout, tuna, salmon, codfish, shrimp, crab, lobster, oyster, scallops Allergy testing results were read and interpreted by provider, documented by clinical staff.   Assessment and plan: Anaphylaxis due to food  -skin testing to fish and shellfish is positive to trout, tuna, salmon, flounder, codfish, shrimp, crab, lobster, oyster, scallops -fish testing is small enough that you may be eligible for in-office fish testing if serum IgE level is low enough -will obtain serum IgE levels for fish/shellfish -continue avoidance of fish and shellfish -have access to self-injectable epinephrine (Epipen or AuviQ) 0.3mg  at all times -follow emergency action plan in case of allergic reaction  Moderate persistent asthma -change Qvar to Arnuity 1 puff once a day.  Arnuity is a dry-powder inhaler meant for once a day use -have access to albuterol inhaler 2 puffs every 4-6 hours as needed for cough/wheeze/shortness of breath/chest tightness.  May use 15-20 minutes prior to activity.   Monitor frequency of use.    Asthma control goals:  Full participation in all desired activities (may need albuterol before activity) Albuterol use two time or less a week on average (not counting use with activity) Cough interfering with sleep two time or less a month Oral steroids no more than once a year No hospitalizations  Eczema -continue moisturization daily and especially after bathing with lotion like Eucerin  Follow-up in 4 months or sooner if needed  I appreciate the opportunity to take part in Dian's care. Please do not hesitate to contact me with questions.  Sincerely,   [name], MD Allergy/Immunology [facility]

## 2021-01-26 NOTE — Patient Instructions (Addendum)
-  skin testing to fish and shellfish is positive to trout, tuna, salmon, flounder, codfish, shrimp, crab, lobster, oyster, scallops -fish testing is small enough that you may be eligible for in-office fish testing if serum IgE level is low enough -will obtain serum IgE levels for fish/shellfish -continue avoidance of fish and shellfish -have access to self-injectable epinephrine (Epipen or AuviQ) 0.3mg  at all times -follow emergency action plan in case of allergic reaction  -change Qvar to Arnuity 1 puff once a day.  Arnuity is a dry-powder inhaler meant for once a day use -have access to albuterol inhaler 2 puffs every 4-6 hours as needed for cough/wheeze/shortness of breath/chest tightness.  May use 15-20 minutes prior to activity.   Monitor frequency of use.    Asthma control goals:  Full participation in all desired activities (may need albuterol before activity) Albuterol use two time or less a week on average (not counting use with activity) Cough interfering with sleep two time or less a month Oral steroids no more than once a year No hospitalizations   -continue moisturization daily and especially after bathing with lotion like Eucerin  Follow-up in 4 months or sooner if needed

## 2021-02-02 LAB — ALLERGEN PROFILE, FOOD-FISH
Allergen Mackerel IgE: <0.1 kU/L
Allergen Salmon IgE: 3.62 kU/L — AB
Allergen Trout IgE: 1.73 kU/L — AB
Allergen Walley Pike IgE: 0.74 kU/L — AB
Codfish IgE: 2.94 kU/L — AB
Halibut IgE: 1.22 kU/L — AB
Tuna: 4.46 kU/L — AB

## 2021-02-02 LAB — ALLERGEN PROFILE, SHELLFISH
Clam IgE: 12.9 kU/L — AB
F023-IgE Crab: 68.5 kU/L — AB
F080-IgE Lobster: 86.3 kU/L — AB
F290-IgE Oyster: 6.59 kU/L — AB
Scallop IgE: 37.6 kU/L — AB
Shrimp IgE: 86.6 kU/L — AB

## 2021-02-18 ENCOUNTER — Other Ambulatory Visit: Payer: Self-pay | Admitting: Family Medicine

## 2021-02-18 DIAGNOSIS — I10 Essential (primary) hypertension: Secondary | ICD-10-CM

## 2021-03-12 ENCOUNTER — Other Ambulatory Visit: Payer: Self-pay | Admitting: Family Medicine

## 2021-03-12 DIAGNOSIS — J4520 Mild intermittent asthma, uncomplicated: Secondary | ICD-10-CM

## 2021-04-12 ENCOUNTER — Other Ambulatory Visit: Payer: Self-pay | Admitting: Family Medicine

## 2021-04-12 DIAGNOSIS — I10 Essential (primary) hypertension: Secondary | ICD-10-CM

## 2021-05-11 ENCOUNTER — Other Ambulatory Visit: Payer: Self-pay | Admitting: Family Medicine

## 2021-05-11 DIAGNOSIS — J4520 Mild intermittent asthma, uncomplicated: Secondary | ICD-10-CM

## 2021-05-13 ENCOUNTER — Other Ambulatory Visit: Payer: Self-pay | Admitting: Family Medicine

## 2021-05-13 DIAGNOSIS — J4520 Mild intermittent asthma, uncomplicated: Secondary | ICD-10-CM

## 2021-05-15 ENCOUNTER — Telehealth: Payer: Self-pay | Admitting: *Deleted

## 2021-05-15 NOTE — Telephone Encounter (Signed)
Patient notified that pharmacy is getting medication ready.

## 2021-05-15 NOTE — Telephone Encounter (Signed)
Spoke with pharmacist and they that ProAir was covered and they will get it changed.  Tried calling patient but no answer and no vm set up.

## 2021-05-15 NOTE — Telephone Encounter (Signed)
Spoke with patient and he stated that the pharmacy stated that he needs a prior authorization. The medication cost was $40 and he did not pick up.  Advised that we will check into this.

## 2021-05-15 NOTE — Telephone Encounter (Signed)
Contact Type Call Who Is Calling Patient / Member / Family / Caregiver Call Type Triage / Clinical Relationship To Patient Self Return Phone Number [phone] (Primary) Chief Complaint Prescription Refill or Medication Request (non symptomatic) Reason for Call Medication Question / Request Initial Comment Caller states he is trying to get a refill of his albuterol and he would like to verify his next appointment. No symptoms. Declined triage.

## 2021-05-29 ENCOUNTER — Encounter: Payer: Self-pay | Admitting: Family Medicine

## 2021-05-29 ENCOUNTER — Ambulatory Visit (INDEPENDENT_AMBULATORY_CARE_PROVIDER_SITE_OTHER): Payer: BC Managed Care – PPO | Admitting: Family Medicine

## 2021-05-29 VITALS — BP 100/60 | HR 91 | Temp 98.3°F | Resp 18 | Ht 71.0 in | Wt 231.6 lb

## 2021-05-29 DIAGNOSIS — G8929 Other chronic pain: Secondary | ICD-10-CM

## 2021-05-29 DIAGNOSIS — E785 Hyperlipidemia, unspecified: Secondary | ICD-10-CM | POA: Diagnosis not present

## 2021-05-29 DIAGNOSIS — Z0000 Encounter for general adult medical examination without abnormal findings: Secondary | ICD-10-CM

## 2021-05-29 DIAGNOSIS — M545 Low back pain, unspecified: Secondary | ICD-10-CM

## 2021-05-29 DIAGNOSIS — J4520 Mild intermittent asthma, uncomplicated: Secondary | ICD-10-CM | POA: Diagnosis not present

## 2021-05-29 DIAGNOSIS — I10 Essential (primary) hypertension: Secondary | ICD-10-CM | POA: Diagnosis not present

## 2021-05-29 LAB — COMPREHENSIVE METABOLIC PANEL
ALT: 34 U/L (ref 0–53)
AST: 26 U/L (ref 0–37)
Albumin: 4.7 g/dL (ref 3.5–5.2)
Alkaline Phosphatase: 53 U/L (ref 39–117)
BUN: 13 mg/dL (ref 6–23)
CO2: 29 mEq/L (ref 19–32)
Calcium: 9.6 mg/dL (ref 8.4–10.5)
Chloride: 104 mEq/L (ref 96–112)
Creatinine, Ser: 1.1 mg/dL (ref 0.40–1.50)
GFR: 85.88 mL/min (ref >60.00)
Glucose, Bld: 95 mg/dL (ref 70–99)
Potassium: 4.5 mEq/L (ref 3.5–5.1)
Sodium: 138 mEq/L (ref 135–145)
Total Bilirubin: 0.5 mg/dL (ref 0.2–1.2)
Total Protein: 7.1 g/dL (ref 6.0–8.3)

## 2021-05-29 LAB — CBC WITH DIFFERENTIAL/PLATELET
Basophils Absolute: 0 10*3/uL (ref 0.0–0.1)
Basophils Relative: 1 % (ref 0.0–3.0)
Eosinophils Absolute: 0.5 10*3/uL (ref 0.0–0.7)
Eosinophils Relative: 10.3 % — ABNORMAL HIGH (ref 0.0–5.0)
HCT: 43.9 % (ref 39.0–52.0)
Hemoglobin: 14.5 g/dL (ref 13.0–17.0)
Lymphocytes Relative: 35.4 % (ref 12.0–46.0)
Lymphs Abs: 1.6 10*3/uL (ref 0.7–4.0)
MCHC: 33 g/dL (ref 30.0–36.0)
MCV: 82.3 fl (ref 78.0–100.0)
Monocytes Absolute: 0.3 10*3/uL (ref 0.1–1.0)
Monocytes Relative: 6.2 % (ref 3.0–12.0)
Neutro Abs: 2.1 10*3/uL (ref 1.4–7.7)
Neutrophils Relative %: 47.1 % (ref 43.0–77.0)
Platelets: 273 10*3/uL (ref 150.0–400.0)
RBC: 5.34 Mil/uL (ref 4.22–5.81)
RDW: 14.4 % (ref 11.5–15.5)
WBC: 4.5 10*3/uL (ref 4.0–10.5)

## 2021-05-29 LAB — LIPID PANEL
Cholesterol: 178 mg/dL (ref 0–200)
HDL: 50.9 mg/dL (ref >39.00)
LDL Cholesterol: 99 mg/dL (ref 0–99)
NonHDL: 127.52
Total CHOL/HDL Ratio: 4
Triglycerides: 141 mg/dL (ref 0.0–149.0)
VLDL: 28.2 mg/dL (ref 0.0–40.0)

## 2021-05-29 MED ORDER — ALBUTEROL SULFATE HFA 108 (90 BASE) MCG/ACT IN AERS: INHALATION_SPRAY | RESPIRATORY_TRACT | 1 refills | Status: DC

## 2021-05-29 MED ORDER — AMLODIPINE BESYLATE 5 MG PO TABS: 5.0000 mg | ORAL_TABLET | Freq: Every day | ORAL | 1 refills | Status: DC

## 2021-05-29 NOTE — Assessment & Plan Note (Signed)
Encourage heart healthy diet such as MIND or DASH diet, increase exercise, avoid trans fats, simple carbohydrates and processed foods, consider a krill or fish or flaxseed oil cap daily.  °

## 2021-05-29 NOTE — Patient Instructions (Addendum)
Preventive Care 21-39 Years Old, Male ?Preventive care refers to lifestyle choices and visits with your health care provider that can promote health and wellness. Preventive care visits are also called wellness exams. ?What can I expect for my preventive care visit? ?Counseling ?During your preventive care visit, your health care provider may ask about your: ?Medical history, including: ?Past medical problems. ?Family medical history. ?Current health, including: ?Emotional well-being. ?Home life and relationship well-being. ?Sexual activity. ?Lifestyle, including: ?Alcohol, nicotine or tobacco, and drug use. ?Access to firearms. ?Diet, exercise, and sleep habits. ?Safety issues such as seatbelt and bike helmet use. ?Sunscreen use. ?Work and work environment. ?Physical exam ?Your health care provider may check your: ?Height and weight. These may be used to calculate your BMI (body mass index). BMI is a measurement that tells if you are at a healthy weight. ?Waist circumference. This measures the distance around your waistline. This measurement also tells if you are at a healthy weight and may help predict your risk of certain diseases, such as type 2 diabetes and high blood pressure. ?Heart rate and blood pressure. ?Body temperature. ?Skin for abnormal spots. ?What immunizations do I need? ?Vaccines are usually given at various ages, according to a schedule. Your health care provider will recommend vaccines for you based on your age, medical history, and lifestyle or other factors, such as travel or where you work. ?What tests do I need? ?Screening ?Your health care provider may recommend screening tests for certain conditions. This may include: ?Lipid and cholesterol levels. ?Diabetes screening. This is done by checking your blood sugar (glucose) after you have not eaten for a while (fasting). ?Hepatitis B test. ?Hepatitis C test. ?HIV (human immunodeficiency virus) test. ?STI (sexually transmitted infection)  testing, if you are at risk. ?Talk with your health care provider about your test results, treatment options, and if necessary, the need for more tests. ?Follow these instructions at home: ?Eating and drinking ? ?Eat a healthy diet that includes fresh fruits and vegetables, whole grains, lean protein, and low-fat dairy products. ?Drink enough fluid to keep your urine pale yellow. ?Take vitamin and mineral supplements as recommended by your health care provider. ?Do not drink alcohol if your health care provider tells you not to drink. ?If you drink alcohol: ?Limit how much you have to 0-2 drinks a day. ?Know how much alcohol is in your drink. In the [place] one drink equals one 12 oz bottle of beer (355 mL), one 5 oz glass of wine (148 mL), or one 1? oz glass of hard liquor (44 mL). ?Lifestyle ?Brush your teeth every morning and night with fluoride toothpaste. Floss one time each day. ?Exercise for at least 30 minutes 5 or more days each week. ?Do not use any products that contain nicotine or tobacco. These products include cigarettes, chewing tobacco, and vaping devices, such as e-cigarettes. If you need help quitting, ask your health care provider. ?Do not use drugs. ?If you are sexually active, practice safe sex. Use a condom or other form of protection to prevent STIs. ?Find healthy ways to manage stress, such as: ?Meditation, yoga, or listening to music. ?Journaling. ?Talking to a trusted person. ?Spending time with friends and family. ?Minimize exposure to UV radiation to reduce your risk of skin cancer. ?Safety ?Always wear your seat belt while driving or riding in a vehicle. ?Do not drive: ?If you have been drinking alcohol. Do not ride with someone who has been drinking. ?If you have been using any mind-altering substances or   drugs. ?While texting. ?When you are tired or distracted. ?Wear a helmet and other protective equipment during sports activities. ?If you have firearms in your house, make sure you  follow all gun safety procedures. ?Seek help if you have been physically or sexually abused. ?What's next? ?Go to your health care provider once a year for an annual wellness visit. ?Ask your health care provider how often you should have your eyes and teeth checked. ?Stay up to date on all vaccines. ?This information is not intended to replace advice given to you by your health care provider. Make sure you discuss any questions you have with your health care provider. ?Document Revised: 09/14/2020 Document Reviewed: 09/14/2020 ?Elsevier Patient Education ? 2022 [employer] ? ?

## 2021-05-29 NOTE — Progress Notes (Signed)
Subjective:   By signing my name below, I, Shehryar Baig, attest that this documentation has been prepared under the direction and in the presence of [name], DO. 05/29/2021    Patient ID: William Zimmerman, male    DOB: May 11, 1985, 36 y.o.   MRN: [id number]  Chief Complaint  Patient presents with   Annual Exam    Pt states fasting     HPI Patient is in today for a comprehensive physical exam. He complains of right lower back pain. He denies any pain radiating down his leg. His pain worsens when moving his right leg in specific motions. He thinks the pain may have started from lifting heavy objects at work. He continues exercising and reports mild relief to his pain. He is requesting to see a chiropractor to manage his back pain.  His blood pressure is doing well during this visit. He reports exercising before this visit. He continues taking 10 mg albuterol daily PO, 50 mg losartan daily PO and reports no new issues while taking them. He measures his blood pressure regularly at home and reports they measure more elevated than normal. He typically measures his blood pressure after exercising at the gym. He denies having any swelling in his ankles.  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 He is planning on ordering an epi-pen.  He continues using albuterol inhaler and QVAR inhaler 2x daily and reports no new issues while taking them.  He denies having any fever, ear pain, new muscle pain, joint pain, new moles, congestion, sinus pain, sore throat, eye pain, chest pain, palpations, cough, SOB, wheezing, n/v/d, constipation, blood in stool, dysuria, frequency, hematuria, or headaches at this time. He has no recent changes to his family medical history. He has no recent  surgical procedures. He continues smoking marijuana daily. He is smoking it as a recreational activity. He no longer smoke cigarettes. He continues working for [employer].  He has 2 Covid-19 vaccines at this time. He has no Covid-19 booster vaccines. He is not interested in receiving a HIV screening.  He lost 11 lb's since his last visit with his allergist. He continues exercising consistently 3x weekly.  Wt Readings from Last 3 Encounters:  05/29/21 231 lb 9.6 oz (105.1 kg)  01/26/21 242 lb 3.2 oz (109.9 kg)  11/21/20 227 lb 3.2 oz (103.1 kg)    Past Medical History:  Diagnosis Date   Asthma    Eczema    Seasonal allergies     No past surgical history on file.  Family History  Problem Relation Age of Onset   Diabetes Mother    Arthritis Mother    Hypertension Mother    Hypertension Father    Diabetes Maternal Grandfather    Arthritis Maternal Grandfather     Social History   Socioeconomic History   Marital status: Married    Spouse name: Not on file   Number of children: Not on file   Years of education: Not on file   Highest education level: Not on file  Occupational History   Not on file  Tobacco Use   Smoking status: Every Day   Smokeless tobacco: Never   Tobacco comments:    Daily marijuana use   Vaping  Use   Vaping Use: Never used  Substance and Sexual Activity   Alcohol use: Yes    Alcohol/week: 4.0 standard drinks    Types: 4 Cans of beer per week    Comment: uses on the weekends only   Drug use: Yes    Types: Marijuana   Sexual activity: Yes    Partners: Female  Other Topics Concern   Not on file  Social History Narrative   Exercise 3 days a week    Social Determinants of Health   Financial Resource Strain: Not on file  Food Insecurity: Not on file  Transportation Needs: Not on file  Physical Activity: Not on file  Stress: Not on file  Social Connections: Not on file  Intimate Partner Violence: Not on file    Outpatient Medications Prior to  Visit  Medication Sig Dispense Refill   losartan (COZAAR) 50 MG tablet TAKE 1 TABLET BY MOUTH EVERY DAY 90 tablet 1   QVAR REDIHALER 40 MCG/ACT inhaler INHALE 2 PUFFS INTO THE LUNGS TWICE A DAY 30 g 2   albuterol (VENTOLIN HFA) 108 (90 Base) MCG/ACT inhaler TAKE 2 PUFFS BY MOUTH EVERY 6 HOURS AS NEEDED FOR WHEEZE OR SHORTNESS OF BREATH 8.5 each 1   amLODipine (NORVASC) 10 MG tablet TAKE 1 TABLET BY MOUTH EVERY DAY 90 tablet 1   Fluticasone Furoate (ARNUITY ELLIPTA) 200 MCG/ACT AEPB Inhale 1 puff into the lungs daily. 30 each 5   No facility-administered medications prior to visit.    No Known Allergies  Review of Systems  Constitutional:  Negative for fever and malaise/fatigue.  HENT:  Negative for congestion, sinus pain and sore throat.   Eyes:  Negative for blurred vision.  Respiratory:  Negative for cough and shortness of breath.   Cardiovascular:  Negative for chest pain, palpitations and leg swelling.  Gastrointestinal:  Negative for blood in stool, constipation, diarrhea, nausea and vomiting.  Genitourinary:  Negative for dysuria and frequency.  Musculoskeletal:  Positive for back pain (right lower). Negative for joint pain and myalgias.  Skin:  Negative for rash.       (-)new moles  Neurological:  Negative for loss of consciousness and headaches.      Objective:    Physical Exam Vitals and nursing note reviewed.  Constitutional:      General: He is not in acute distress.    Appearance: Normal appearance. He is well-developed. He is not ill-appearing.  HENT:     Head: Normocephalic and atraumatic.     Right Ear: Tympanic membrane, ear canal and external ear normal.     Left Ear: Tympanic membrane, ear canal and external ear normal.  Eyes:     Extraocular Movements: Extraocular movements intact.     Pupils: Pupils are equal, round, and reactive to light.  Neck:     Thyroid: No thyromegaly.  Cardiovascular:     Rate and Rhythm: Normal rate and regular rhythm.      Pulses: Normal pulses.     Heart sounds: Normal heart sounds. No murmur heard. Pulmonary:     Effort: Pulmonary effort is normal. No respiratory distress.     Breath sounds: Normal breath sounds. No wheezing or rales.  Chest:     Chest wall: No tenderness.  Abdominal:     General: Bowel sounds are normal. There is no distension.     Palpations: Abdomen is soft.     Tenderness: There is no abdominal tenderness. There is no guarding.  Musculoskeletal:  General: No tenderness.     Cervical back: Normal range of motion and neck supple.  Skin:    General: Skin is warm and dry.  Neurological:     General: No focal deficit present.     Mental Status: He is alert and oriented to person, place, and time.  Psychiatric:        Behavior: Behavior normal.        Thought Content: Thought content normal.        Judgment: Judgment normal.    BP 100/60 (BP Location: Right Arm, Patient Position: Sitting, Cuff Size: Large)    Pulse 91    Temp 98.3 F (36.8 C) (Oral)    Resp 18    Ht 5\' 11"  (1.803 m)    Wt 231 lb 9.6 oz (105.1 kg)    SpO2 97%    BMI 32.30 kg/m  Wt Readings from Last 3 Encounters:  05/29/21 231 lb 9.6 oz (105.1 kg)  01/26/21 242 lb 3.2 oz (109.9 kg)  11/21/20 227 lb 3.2 oz (103.1 kg)    Diabetic Foot Exam - Simple   No data filed    Lab Results  Component Value Date   WBC 4.8 05/16/2020   HGB 14.5 05/16/2020   HCT 43.3 05/16/2020   PLT 272.0 05/16/2020   GLUCOSE 88 11/21/2020   CHOL 208 (H) 11/21/2020   TRIG 71.0 11/21/2020   HDL 85.30 11/21/2020   LDLCALC 109 (H) 11/21/2020   ALT 34 11/21/2020   AST 41 (H) 11/21/2020   NA 139 11/21/2020   K 4.6 11/21/2020   CL 103 11/21/2020   CREATININE 1.14 11/21/2020   BUN 12 11/21/2020   CO2 29 11/21/2020   TSH 0.51 05/16/2020   MICROALBUR 2.1 (H) 05/16/2020    Lab Results  Component Value Date   TSH 0.51 05/16/2020   Lab Results  Component Value Date   WBC 4.8 05/16/2020   HGB 14.5 05/16/2020   HCT 43.3  05/16/2020   MCV 82.3 05/16/2020   PLT 272.0 05/16/2020   Lab Results  Component Value Date   NA 139 11/21/2020   K 4.6 11/21/2020   CO2 29 11/21/2020   GLUCOSE 88 11/21/2020   BUN 12 11/21/2020   CREATININE 1.14 11/21/2020   BILITOT 0.5 11/21/2020   ALKPHOS 57 11/21/2020   AST 41 (H) 11/21/2020   ALT 34 11/21/2020   PROT 7.2 11/21/2020   ALBUMIN 4.3 11/21/2020   CALCIUM 9.6 11/21/2020   ANIONGAP 12 07/05/2019   GFR 82.57 11/21/2020   Lab Results  Component Value Date   CHOL 208 (H) 11/21/2020   Lab Results  Component Value Date   HDL 85.30 11/21/2020   Lab Results  Component Value Date   LDLCALC 109 (H) 11/21/2020   Lab Results  Component Value Date   TRIG 71.0 11/21/2020   Lab Results  Component Value Date   CHOLHDL 2 11/21/2020   No results found for: HGBA1C      Assessment & Plan:   Problem List Items Addressed This Visit       Unprioritized   Hyperlipidemia LDL goal <100    Encourage heart healthy diet such as MIND or DASH diet, increase exercise, avoid trans fats, simple carbohydrates and processed foods, consider a krill or fish or flaxseed oil cap daily.       Relevant Medications   amLODipine (NORVASC) 5 MG tablet   Mild intermittent asthma    Reminded pt to use qvar bid  every day      Relevant Medications   albuterol (VENTOLIN HFA) 108 (90 Base) MCG/ACT inhaler   Other Relevant Orders   TSH   CBC with Differential/Platelet   Preventative health care    ghm utd Check labs  See avs      Relevant Orders   TSH   CBC with Differential/Platelet   Primary hypertension - Primary    Well controlled, no changes to meds. Encouraged heart healthy diet such as the DASH diet and exercise as tolerated.  running low 'cut norvasc to 5 mg       Relevant Medications   amLODipine (NORVASC) 5 MG tablet   Other Relevant Orders   Comprehensive metabolic panel   Lipid panel   TSH   CBC with Differential/Platelet   Other Visit Diagnoses      Chronic right-sided low back pain without sciatica       Relevant Orders   Ambulatory referral to Chiropractic        Meds ordered this encounter  Medications   albuterol (VENTOLIN HFA) 108 (90 Base) MCG/ACT inhaler    Sig: TAKE 2 PUFFS BY MOUTH EVERY 6 HOURS AS NEEDED FOR WHEEZE OR SHORTNESS OF BREATH    Dispense:  8.5 each    Refill:  1    DX Code Needed  PATIENT NEEDS NEW SCRIPT.   amLODipine (NORVASC) 5 MG tablet    Sig: Take 1 tablet (5 mg total) by mouth daily.    Dispense:  90 tablet    Refill:  1    I, [name], DO, personally preformed the services described in this documentation.  All medical record entries made by the scribe were at my direction and in my presence.  I have reviewed the chart and discharge instructions (if applicable) and agree that the record reflects my personal performance and is accurate and complete. 05/29/2021   I,Shehryar Baig,acting as a scribe for [name], DO.,have documented all relevant documentation on the behalf of [name], DO,as directed by  [name], DO while in the presence of [name], DO.   [name], DO

## 2021-05-29 NOTE — Assessment & Plan Note (Signed)
Well controlled, no changes to meds. Encouraged heart healthy diet such as the DASH diet and exercise as tolerated.  running low 'cut norvasc to 5 mg

## 2021-05-29 NOTE — Assessment & Plan Note (Signed)
Reminded pt to use qvar bid every day

## 2021-05-29 NOTE — Assessment & Plan Note (Signed)
ghm utd Check labs  See avs  

## 2021-05-30 LAB — TSH: TSH: 0.43 u[IU]/mL (ref 0.35–5.50)

## 2021-06-08 ENCOUNTER — Ambulatory Visit: Payer: BC Managed Care – PPO | Admitting: Allergy

## 2021-06-19 ENCOUNTER — Ambulatory Visit: Payer: BC Managed Care – PPO | Admitting: Family Medicine

## 2021-06-26 ENCOUNTER — Ambulatory Visit: Payer: BC Managed Care – PPO | Admitting: Family Medicine

## 2021-06-26 ENCOUNTER — Encounter: Payer: Self-pay | Admitting: Family Medicine

## 2021-06-26 DIAGNOSIS — E785 Hyperlipidemia, unspecified: Secondary | ICD-10-CM

## 2021-06-26 DIAGNOSIS — I10 Essential (primary) hypertension: Secondary | ICD-10-CM

## 2021-06-26 DIAGNOSIS — J4520 Mild intermittent asthma, uncomplicated: Secondary | ICD-10-CM | POA: Diagnosis not present

## 2021-06-26 DIAGNOSIS — J45998 Other asthma: Secondary | ICD-10-CM

## 2021-06-26 MED ORDER — ALBUTEROL SULFATE HFA 108 (90 BASE) MCG/ACT IN AERS: INHALATION_SPRAY | RESPIRATORY_TRACT | 1 refills | Status: DC

## 2021-06-26 NOTE — Assessment & Plan Note (Signed)
Well controlled, no changes to meds. Encouraged heart healthy diet such as the DASH diet and exercise as tolerated.  °

## 2021-06-26 NOTE — Patient Instructions (Signed)

## 2021-06-26 NOTE — Assessment & Plan Note (Signed)
Refill albuterol ?con't qvar  ?

## 2021-06-26 NOTE — Assessment & Plan Note (Signed)
Tolerating statin, encouraged heart healthy diet, avoid trans fats, minimize simple carbs and saturated fats. Increase exercise as tolerated 

## 2021-06-26 NOTE — Progress Notes (Signed)
? ?Subjective:  ? ?By signing my name below, I, Carylon Perches, attest that this documentation has been prepared under the direction and in the presence of Roma Schanz R DO 06/26/2021  ? ? Patient ID: William Zimmerman, male    DOB: [birth year], 36 y.o.   MRN: [id number] ? ?Chief Complaint  ?Patient presents with  ?? Follow-up  ?  Here for follow up   ? ? ?HPI ?Patient is in today for an office visit. ? ?He is requesting a refill of 108 MCG/ACT of Albuterol.  ? ?As of today's visit, his blood pressure is well. ?BP Readings from Last 3 Encounters:  ?06/26/21 118/74  ?05/29/21 100/60  ?01/26/21 (!) 142/80  ? ?He has been seeing a chiropractor once a week. He states that his sessions have been helping him.  ? ?Past Medical History:  ?Diagnosis Date  ?? Asthma   ?? Eczema   ?? Seasonal allergies   ? ? ?No past surgical history on file. ? ?Family History  ?Problem Relation Age of Onset  ?? Diabetes Mother   ?? Arthritis Mother   ?? Hypertension Mother   ?? Hypertension Father   ?? Diabetes Maternal Grandfather   ?? Arthritis Maternal Grandfather   ? ? ?Social History  ? ?Socioeconomic History  ?? Marital status: Married  ?  Spouse name: Not on file  ?? Number of children: Not on file  ?? Years of education: Not on file  ?? Highest education level: Not on file  ?Occupational History  ?? Not on file  ?Tobacco Use  ?? Smoking status: Every Day  ?? Smokeless tobacco: Never  ?? Tobacco comments:  ?  Daily marijuana use   ?Vaping Use  ?? Vaping Use: Never used  ?Substance and Sexual Activity  ?? Alcohol use: Yes  ?  Alcohol/week: 4.0 standard drinks  ?  Types: 4 Cans of beer per week  ?  Comment: uses on the weekends only  ?? Drug use: Yes  ?  Types: Marijuana  ?? Sexual activity: Yes  ?  Partners: Female  ?Other Topics Concern  ?? Not on file  ?Social History Narrative  ? Exercise 3 days a week   ? ?Social Determinants of Health  ? ?Financial Resource Strain: Not on file  ?Food Insecurity: Not on file  ?Transportation  Needs: Not on file  ?Physical Activity: Not on file  ?Stress: Not on file  ?Social Connections: Not on file  ?Intimate Partner Violence: Not on file  ? ? ?Outpatient Medications Prior to Visit  ?Medication Sig Dispense Refill  ?? amLODipine (NORVASC) 5 MG tablet Take 1 tablet (5 mg total) by mouth daily. 90 tablet 1  ?? losartan (COZAAR) 50 MG tablet TAKE 1 TABLET BY MOUTH EVERY DAY 90 tablet 1  ?? QVAR REDIHALER 40 MCG/ACT inhaler INHALE 2 PUFFS INTO THE LUNGS TWICE A DAY 30 g 2  ?? albuterol (VENTOLIN HFA) 108 (90 Base) MCG/ACT inhaler TAKE 2 PUFFS BY MOUTH EVERY 6 HOURS AS NEEDED FOR WHEEZE OR SHORTNESS OF BREATH 8.5 each 1  ? ?No facility-administered medications prior to visit.  ? ? ?No Known Allergies ? ?Review of Systems  ?Constitutional:  Negative for fever and malaise/fatigue.  ?HENT:  Negative for congestion.   ?Eyes:  Negative for blurred vision.  ?Respiratory:  Negative for shortness of breath.   ?Cardiovascular:  Negative for chest pain, palpitations and leg swelling.  ?Gastrointestinal:  Negative for abdominal pain, blood in stool and nausea.  ?Genitourinary:  Negative  for dysuria and frequency.  ?Musculoskeletal:  Negative for falls.  ?Skin:  Negative for rash.  ?Neurological:  Negative for dizziness, loss of consciousness and headaches.  ?Endo/Heme/Allergies:  Negative for environmental allergies.  ?Psychiatric/Behavioral:  Negative for depression. The patient is not nervous/anxious.   ? ?   ?Objective:  ?  ?Physical Exam ?Constitutional:   ?   General: He is not in acute distress. ?   Appearance: Normal appearance. He is not ill-appearing.  ?HENT:  ?   Head: Normocephalic and atraumatic.  ?   Right Ear: External ear normal.  ?   Left Ear: External ear normal.  ?Eyes:  ?   Extraocular Movements: Extraocular movements intact.  ?   Pupils: Pupils are equal, round, and reactive to light.  ?Cardiovascular:  ?   Rate and Rhythm: Normal rate and regular rhythm.  ?   Heart sounds: Normal heart sounds. No  murmur heard. ?  No gallop.  ?Pulmonary:  ?   Effort: Pulmonary effort is normal. No respiratory distress.  ?   Breath sounds: Normal breath sounds. No wheezing or rales.  ?Skin: ?   General: Skin is warm and dry.  ?Neurological:  ?   Mental Status: He is alert and oriented to person, place, and time.  ?Psychiatric:     ?   Judgment: Judgment normal.  ? ? ?BP 118/74 (BP Location: Right Arm, Patient Position: Sitting, Cuff Size: Normal)   Pulse 66   Temp 98.4 ?F (36.9 ?C) (Oral)   Resp 16   Ht 5\' 11"  (1.803 m)   Wt 225 lb 6.4 oz (102.2 kg)   SpO2 96%   BMI 31.44 kg/m?  ?Wt Readings from Last 3 Encounters:  ?06/26/21 225 lb 6.4 oz (102.2 kg)  ?05/29/21 231 lb 9.6 oz (105.1 kg)  ?01/26/21 242 lb 3.2 oz (109.9 kg)  ? ? ?Diabetic Foot Exam - Simple   ?No data filed ?  ? ?Lab Results  ?Component Value Date  ? WBC 4.5 05/29/2021  ? HGB 14.5 05/29/2021  ? HCT 43.9 05/29/2021  ? PLT 273.0 05/29/2021  ? GLUCOSE 95 05/29/2021  ? CHOL 178 05/29/2021  ? TRIG 141.0 05/29/2021  ? HDL 50.90 05/29/2021  ? [place] 99 05/29/2021  ? ALT 34 05/29/2021  ? AST 26 05/29/2021  ? NA 138 05/29/2021  ? K 4.5 05/29/2021  ? CL 104 05/29/2021  ? CREATININE 1.10 05/29/2021  ? BUN 13 05/29/2021  ? CO2 29 05/29/2021  ? TSH 0.43 05/29/2021  ? MICROALBUR 2.1 (H) 05/16/2020  ? ? ?Lab Results  ?Component Value Date  ? TSH 0.43 05/29/2021  ? ?Lab Results  ?Component Value Date  ? WBC 4.5 05/29/2021  ? HGB 14.5 05/29/2021  ? HCT 43.9 05/29/2021  ? MCV 82.3 05/29/2021  ? PLT 273.0 05/29/2021  ? ?Lab Results  ?Component Value Date  ? NA 138 05/29/2021  ? K 4.5 05/29/2021  ? CO2 29 05/29/2021  ? GLUCOSE 95 05/29/2021  ? BUN 13 05/29/2021  ? CREATININE 1.10 05/29/2021  ? BILITOT 0.5 05/29/2021  ? ALKPHOS 53 05/29/2021  ? AST 26 05/29/2021  ? ALT 34 05/29/2021  ? PROT 7.1 05/29/2021  ? ALBUMIN 4.7 05/29/2021  ? CALCIUM 9.6 05/29/2021  ? ANIONGAP 12 07/05/2019  ? GFR 85.88 05/29/2021  ? ?Lab Results  ?Component Value Date  ? CHOL 178 05/29/2021  ? ?Lab  Results  ?Component Value Date  ? HDL 50.90 05/29/2021  ? ?Lab Results  ?Component Value  Date  ? [place] 99 05/29/2021  ? ?Lab Results  ?Component Value Date  ? TRIG 141.0 05/29/2021  ? ?Lab Results  ?Component Value Date  ? CHOLHDL 4 05/29/2021  ? ?No results found for: HGBA1C ? ?   ?Assessment & Plan:  ? ?Problem List Items Addressed This Visit   ? ?  ? Unprioritized  ? Mild intermittent asthma  ? Relevant Medications  ? albuterol (VENTOLIN HFA) 108 (90 Base) MCG/ACT inhaler  ? Hyperlipidemia LDL goal <100  ?  Tolerating statin, encouraged heart healthy diet, avoid trans fats, minimize simple carbs and saturated fats. Increase exercise as tolerated ?  ?  ? Primary hypertension  ?  Well controlled, no changes to meds. Encouraged heart healthy diet such as the DASH diet and exercise as tolerated.  ?  ?  ? Uncontrolled persistent asthma  ?  Refill albuterol ?con't qvar  ?  ?  ? Relevant Medications  ? albuterol (VENTOLIN HFA) 108 (90 Base) MCG/ACT inhaler  ? ? ? ? ?Meds ordered this encounter  ?Medications  ?? albuterol (VENTOLIN HFA) 108 (90 Base) MCG/ACT inhaler  ?  Sig: TAKE 2 PUFFS BY MOUTH EVERY 6 HOURS AS NEEDED FOR WHEEZE OR SHORTNESS OF BREATH  ?  Dispense:  8.5 each  ?  Refill:  1  ?  DX Code Needed  PATIENT NEEDS NEW SCRIPT.  ? ? ?I, [name], DO, personally preformed the services described in this documentation.  All medical record entries made by the scribe were at my direction and in my presence.  I have reviewed the chart and discharge instructions (if applicable) and agree that the record reflects my personal performance and is accurate and complete. 06/26/2021 ? ? ?I,Amber Collins,acting as a Education administrator for [employer], DO.,have documented all relevant documentation on the behalf of [name], DO,as directed by  [name], DO while in the presence of [name], DO. ? ? ? ?[name], DO ? ?

## 2021-07-12 ENCOUNTER — Ambulatory Visit (INDEPENDENT_AMBULATORY_CARE_PROVIDER_SITE_OTHER): Payer: BC Managed Care – PPO | Admitting: Allergy

## 2021-07-12 ENCOUNTER — Other Ambulatory Visit: Payer: Self-pay | Admitting: Allergy

## 2021-07-12 ENCOUNTER — Encounter: Payer: Self-pay | Admitting: Allergy

## 2021-07-12 VITALS — BP 130/88 | HR 73 | Temp 98.3°F | Resp 16 | Ht 71.0 in | Wt 228.0 lb

## 2021-07-12 DIAGNOSIS — L2089 Other atopic dermatitis: Secondary | ICD-10-CM | POA: Diagnosis not present

## 2021-07-12 DIAGNOSIS — J4540 Moderate persistent asthma, uncomplicated: Secondary | ICD-10-CM

## 2021-07-12 DIAGNOSIS — T7809XD Anaphylactic reaction due to other food products, subsequent encounter: Secondary | ICD-10-CM

## 2021-07-12 MED ORDER — EPINEPHRINE 0.3 MG/0.3ML IJ SOAJ: 0.3000 mg | Freq: Once | INTRAMUSCULAR | 1 refills | Status: AC

## 2021-07-12 MED ORDER — QVAR REDIHALER 80 MCG/ACT IN AERB: 2.0000 | INHALATION_SPRAY | Freq: Two times a day (BID) | RESPIRATORY_TRACT | 5 refills | Status: DC

## 2021-07-12 NOTE — Patient Instructions (Addendum)
-  continue avoidance of fish and shellfish ?-if you become interested in determining if you are not fish allergic then would recommend you have serum IgE levels for fish/shellfish done.  This is labwork and you can return to our [place] office during office hours to lab draw.   ?-have access to self-injectable epinephrine (Epipen or AuviQ) 0.3mg  at all times ?-follow emergency action plan in case of allergic reaction ? ?-increase to Qvar 2 puffs twice  a day.  This is a maintenance controller medications to decrease asthma symptoms and decrease need to use your rescue albuterol inhaler ?-have access to albuterol inhaler 2 puffs every 4-6 hours as needed for cough/wheeze/shortness of breath/chest tightness.  May use 15-20 minutes prior to activity.   Monitor frequency of use.   ? ?Asthma control goals:  ?Full participation in all desired activities (may need albuterol before activity) ?Albuterol use two time or less a week on average (not counting use with activity) ?Cough interfering with sleep two time or less a month ?Oral steroids no more than once a year ?No hospitalizations ? ? ?-continue moisturization daily and especially after bathing with lotion like Eucerin ? ?Follow-up in 4-6 months or sooner if needed   ?

## 2021-07-12 NOTE — Progress Notes (Signed)
? ? ?Follow-up Note ? ?RE: William Zimmerman MRN: [id number] DOB: 1986/02/27 ?Date of Office Visit: 07/12/2021 ? ? ?History of present illness: ?William Zimmerman is a 36 y.o. male presenting today for follow-up of food allergy, asthma and eczema.  He was last seen in the office on 01/26/2021 for initial evaluation by myself. ?He has been avoiding shellfish.  He states he tries to avoid fish as well and has no interest in eating fish.  He does state he has had the [place] damage from [place] and did fine with that but otherwise does not eat any other fish.  The tuna used there is can tuna.  He currently does not have an epinephrine device.  He states he will pick it up from the pharmacy now.  He fortunately has not had any reactions necessitating epinephrine use. ?With his asthma he states he is using his albuterol almost daily for shortness of breath symptoms.  He states he is using his low-dose Qvar but only daily.  He has not had any ED or urgent care visits or systemic steroid needs for asthma. ?His eczema is doing quite well but states he has had some flareups on the neck area.  Uses his cream for moisturization and that seemed to do the trick. ? ?Review of systems: ?Review of Systems  ?Constitutional: Negative.   ?HENT: Negative.    ?Eyes: Negative.   ?Respiratory:  Positive for shortness of breath.   ?Cardiovascular: Negative.   ?Musculoskeletal: Negative.   ?Skin: Negative.   ?Allergic/Immunologic: Negative.   ?Neurological: Negative.    ? ?All other systems negative unless noted above in HPI ? ?Past medical/social/surgical/family history have been reviewed and are unchanged unless specifically indicated below. ? ?No changes ? ?Medication List: ?Current Outpatient Medications  ?Medication Sig Dispense Refill  ? albuterol (VENTOLIN HFA) 108 (90 Base) MCG/ACT inhaler TAKE 2 PUFFS BY MOUTH EVERY 6 HOURS AS NEEDED FOR WHEEZE OR SHORTNESS OF BREATH 8.5 each 1  ? amLODipine (NORVASC) 5 MG tablet Take 1 tablet (5 mg total)  by mouth daily. 90 tablet 1  ? losartan (COZAAR) 50 MG tablet TAKE 1 TABLET BY MOUTH EVERY DAY 90 tablet 1  ? QVAR REDIHALER 40 MCG/ACT inhaler INHALE 2 PUFFS INTO THE LUNGS TWICE A DAY 30 g 2  ? ?No current facility-administered medications for this visit.  ?  ? ?Known medication allergies: ?No Known Allergies ? ? ?Physical examination: ?Blood pressure 130/88, pulse 73, temperature 98.3 ?F (36.8 ?C), resp. rate 16, height 5\' 11"  (1.803 m), weight 228 lb (103.4 kg), SpO2 96 %. ? ?General: Alert, interactive, in no acute distress. ?HEENT: PERRLA, TMs pearly gray, turbinates non-edematous without discharge, post-pharynx non erythematous. ?Neck: Supple without lymphadenopathy. ?Lungs: Clear to auscultation without wheezing, rhonchi or rales. {no increased work of breathing. ?CV: Normal S1, S2 without murmurs. ?Abdomen: Nondistended, nontender. ?Skin: Warm and dry, without lesions or rashes. ?Extremities:  No clubbing, cyanosis or edema. ?Neuro:   Grossly intact. ? ?Diagnositics/Labs: ?Spirometry: FEV1 1.56 L 41%, FVC 2.99 L 64% predicted.  This is a restrictive pattern.  This seems to be stable from his previous study. ? ?Assessment and plan: ?Anaphylaxis due to food ?-continue avoidance of fish and shellfish ?-if you become interested in determining if you are not fish allergic then would recommend you have serum IgE levels for fish/shellfish done.  This is labwork and you can return to our [place] office during office hours to lab draw.   ?-have access to self-injectable epinephrine (Epipen or  AuviQ) 0.3mg  at all times ?-follow emergency action plan in case of allergic reaction ? ?Moderate persistent asthma ?-increase to Qvar 2 puffs twice  a day.  This is a maintenance controller medications to decrease asthma symptoms and decrease need to use your rescue albuterol inhaler ?-have access to albuterol inhaler 2 puffs every 4-6 hours as needed for cough/wheeze/shortness of breath/chest tightness.  May use 15-20  minutes prior to activity.   Monitor frequency of use.   ? ?Asthma control goals:  ?Full participation in all desired activities (may need albuterol before activity) ?Albuterol use two time or less a week on average (not counting use with activity) ?Cough interfering with sleep two time or less a month ?Oral steroids no more than once a year ?No hospitalizations ? ?Eczema ?-continue moisturization daily and especially after bathing with lotion like Eucerin ? ?Follow-up in 4-6 months or sooner if needed   ?I appreciate the opportunity to take part in William Zimmerman's care. Please do not hesitate to contact me with questions. ? ?Sincerely, ? ? ?[name], MD ?Allergy/Immunology ?[facility] ? ? ?

## 2021-07-13 NOTE — Telephone Encounter (Signed)
Patients insurance doesn't cover the qvar 80 mcg, but does cover flovent 220 hfa and pulmicort 180 mcg which one would you like [place] to send in? ?

## 2021-08-15 IMAGING — DX DG CHEST 2V
2 series · 2 of 2 positions shown · non-contrast
Comparison: March 29, 2012

CLINICAL DATA: Chest tightness.  History of asthma

EXAM:
CHEST - 2 VIEW

[chest pa]
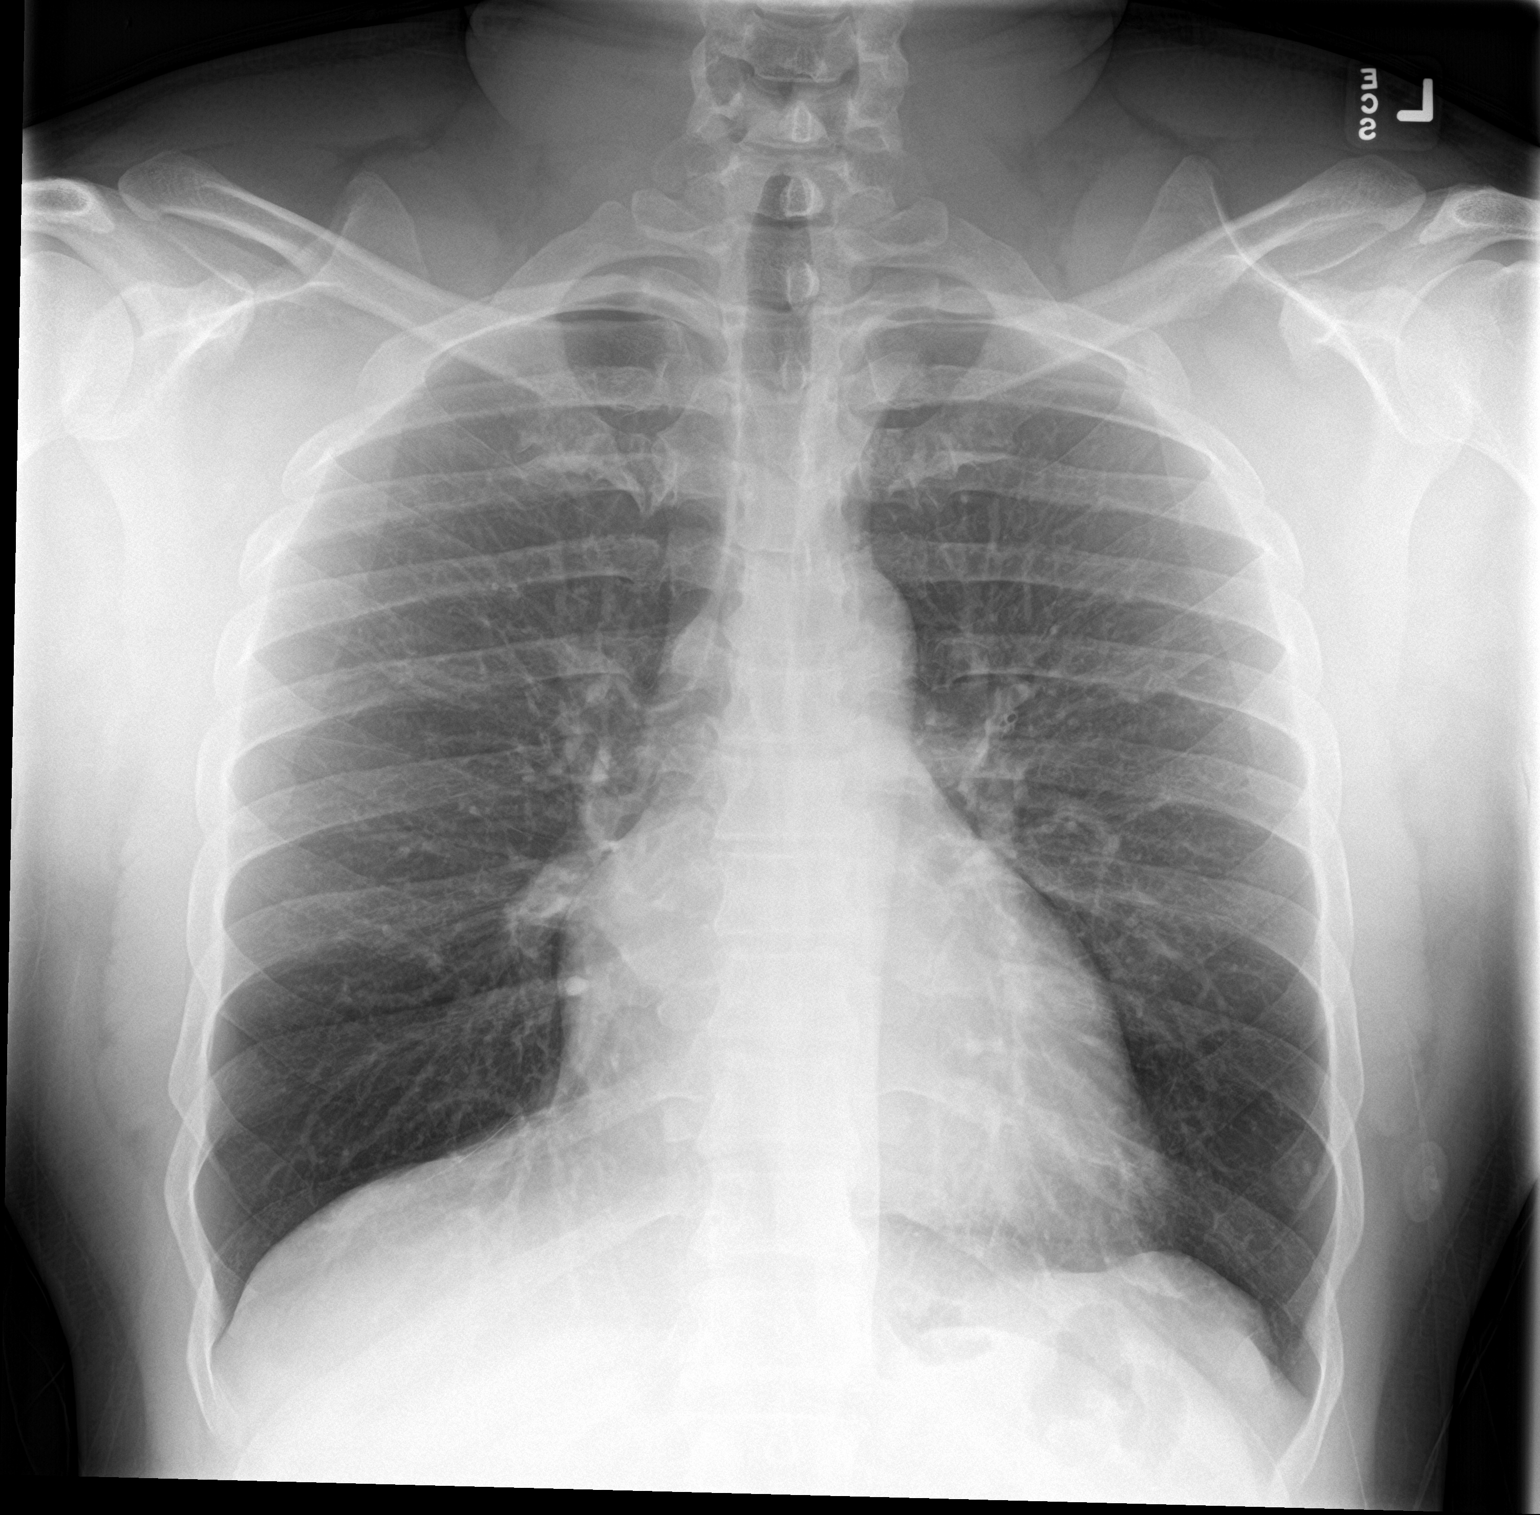

[chest lat]
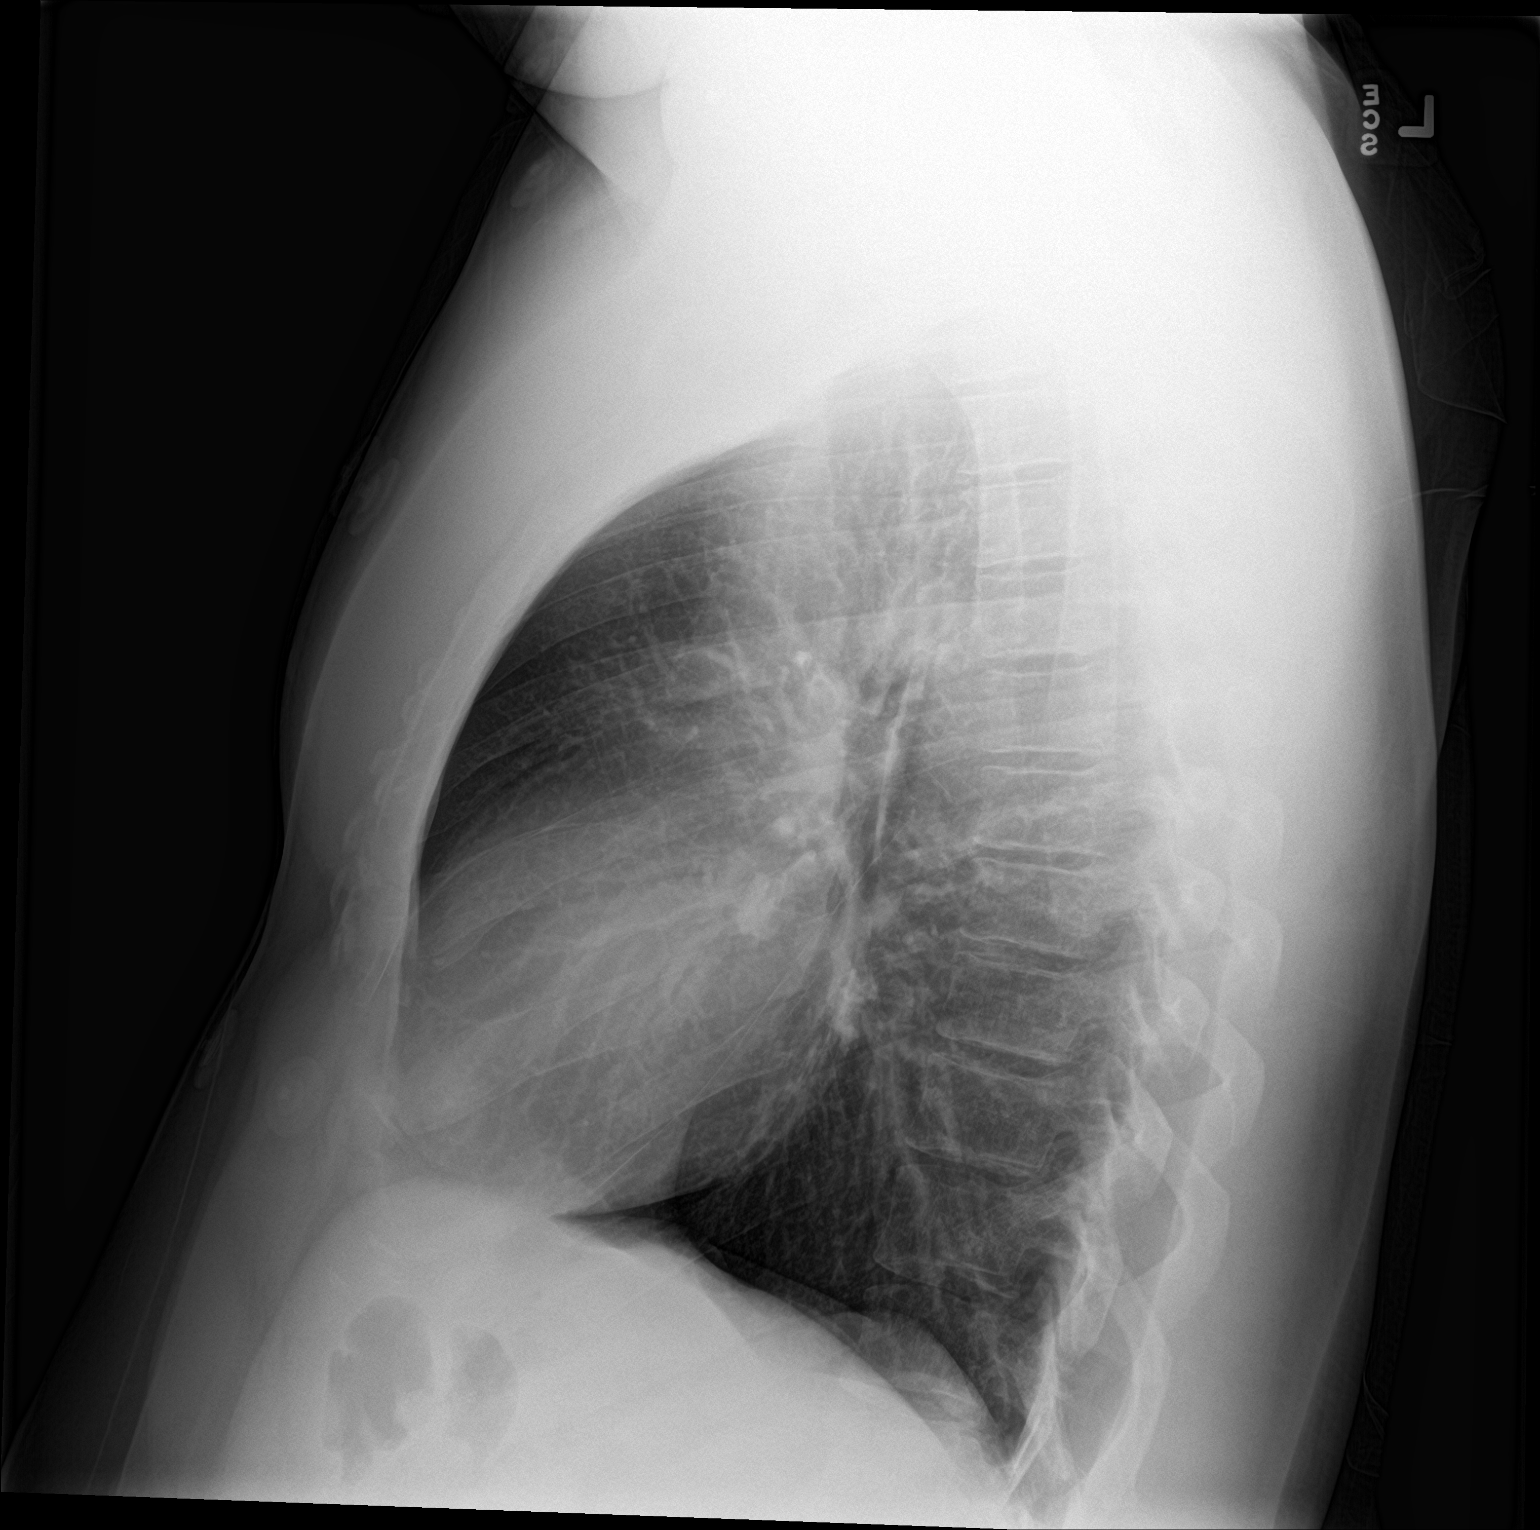

[2 of 2 positions shown; findings below may reference images not displayed]

FINDINGS: Lungs are clear. Heart size and pulmonary vascularity are normal. No
adenopathy. No pneumothorax. No bone lesions.
IMPRESSION: Lungs clear.  Cardiac silhouette within normal limits.

## 2021-08-31 ENCOUNTER — Other Ambulatory Visit: Payer: Self-pay | Admitting: Family Medicine

## 2021-08-31 DIAGNOSIS — J4520 Mild intermittent asthma, uncomplicated: Secondary | ICD-10-CM

## 2021-08-31 DIAGNOSIS — I10 Essential (primary) hypertension: Secondary | ICD-10-CM

## 2021-09-13 ENCOUNTER — Telehealth: Payer: Self-pay

## 2021-09-13 NOTE — Telephone Encounter (Signed)
Caller Name Darus Hershman Caller Phone Number [phone] Patient Name William Zimmerman Patient DOB [birth year] Call Type Message Only Information Provided Reason for Call Request for General Office Information Initial Comment Caller has questions about his refill price, please call the patient back, no symptoms. Disp. Time Disposition Final User 09/13/2021 12:57:45 PM General Information Provided Yes Donnetta Hutching Call Closed By: Donnetta Hutching Transaction Date/Time: 09/13/2021 12:55:16 PM (ET)

## 2021-09-14 ENCOUNTER — Telehealth: Payer: Self-pay | Admitting: Allergy

## 2021-09-14 NOTE — Telephone Encounter (Signed)
Patient called and said that he went to pickup his Flovent inhaler and they told him it was $200.00 and he only pay $5.00. [facility] [address] [phone].

## 2021-09-15 ENCOUNTER — Telehealth: Payer: Self-pay | Admitting: Allergy

## 2021-09-15 NOTE — Telephone Encounter (Signed)
Attempted to call patient. Phone rang twice then stated that the VM is not set up

## 2021-09-15 NOTE — Telephone Encounter (Signed)
William Zimmerman called in and states that his insurance is no longer covering Flovent and wants to know if there is something else that can be called in in its place?  He would like that called in to [facility] on [address]

## 2021-09-18 NOTE — Telephone Encounter (Signed)
Called however unable to leave a message as voicemail has not been set up yet.

## 2021-09-27 NOTE — Telephone Encounter (Signed)
I called and spoke to patient and informed him of the inhalers Dr. Delorse Lek recommended. Patient stated that he did speak to his insurance company and was provided with a inhaler that they would cover but had to search for it. I informed patient to contact us telephone or mychart to provide [place] with the name of the covered inhaler.  I sent the provided list of compatible inhalers to patient via mychart.

## 2021-10-26 ENCOUNTER — Other Ambulatory Visit: Payer: Self-pay | Admitting: Family Medicine

## 2021-10-26 DIAGNOSIS — J4520 Mild intermittent asthma, uncomplicated: Secondary | ICD-10-CM

## 2021-12-09 ENCOUNTER — Other Ambulatory Visit: Payer: Self-pay | Admitting: Family Medicine

## 2021-12-09 DIAGNOSIS — J4520 Mild intermittent asthma, uncomplicated: Secondary | ICD-10-CM

## 2022-01-03 ENCOUNTER — Ambulatory Visit: Payer: BC Managed Care – PPO | Admitting: Allergy

## 2022-03-01 ENCOUNTER — Encounter: Payer: Self-pay | Admitting: Allergy

## 2022-03-01 ENCOUNTER — Other Ambulatory Visit: Payer: Self-pay

## 2022-03-01 ENCOUNTER — Ambulatory Visit (INDEPENDENT_AMBULATORY_CARE_PROVIDER_SITE_OTHER): Payer: BC Managed Care – PPO | Admitting: Allergy

## 2022-03-01 VITALS — BP 142/94 | HR 84 | Temp 98.7°F | Resp 16 | Wt 253.0 lb

## 2022-03-01 DIAGNOSIS — T7809XD Anaphylactic reaction due to other food products, subsequent encounter: Secondary | ICD-10-CM

## 2022-03-01 DIAGNOSIS — J4540 Moderate persistent asthma, uncomplicated: Secondary | ICD-10-CM | POA: Diagnosis not present

## 2022-03-01 DIAGNOSIS — L2089 Other atopic dermatitis: Secondary | ICD-10-CM | POA: Diagnosis not present

## 2022-03-01 MED ORDER — BUDESONIDE-FORMOTEROL FUMARATE 160-4.5 MCG/ACT IN AERO: 2.0000 | INHALATION_SPRAY | Freq: Two times a day (BID) | RESPIRATORY_TRACT | 5 refills | Status: DC

## 2022-03-01 NOTE — Patient Instructions (Signed)
-  continue avoidance of fish and shellfish -if you become interested in determining if you are not fish allergic then would recommend you have serum IgE levels for fish/shellfish done.  This is labwork and you can return to our [place] office during office hours to lab draw.   -have access to self-injectable epinephrine (Epipen or AuviQ) 0.3mg  at all times -follow emergency action plan in case of allergic reaction  -stop QVAR -start Symbicort 2 puffs twice a day. This is a maintenance controller medications to decrease asthma symptoms and decrease need to use your rescue albuterol inhaler.   This inhaler can also be used a rescue inhaler (like albuterol) and can use 2 puffs as needed.  Max dosing is 10 puffs/day -have access to albuterol inhaler 2 puffs every 4-6 hours as needed for cough/wheeze/shortness of breath/chest tightness.  May use 15-20 minutes prior to activity.   Monitor frequency of use.    Asthma control goals:  Full participation in all desired activities (may need albuterol before activity) Albuterol use two time or less a week on average (not counting use with activity) Cough interfering with sleep two time or less a month Oral steroids no more than once a year No hospitalizations   -continue moisturization daily and especially after bathing with lotion like Eucerin  Follow-up in 6 months or sooner if needed

## 2022-03-01 NOTE — Progress Notes (Signed)
Follow-up Note  RE: William Zimmerman MRN: [id number] DOB: March 27, 1986 Date of Office Visit: 03/01/2022   History of present illness: William Zimmerman is a 36 y.o. male presenting today for follow-up of asthma, food allergy and eczema.  He was last seen in the office on 07/12/2021 by myself.  He has not had any major health changes, surgeries or hospitalizations since the last visit.  He states his insurance is no longer covering Flovent or Qvar.  He has about 18 puffs left on his Qvar.  His Flovent is empty.  He states he is using Qvar 2 puffs twice a day.  He states he still needs to use albuterol daily for chest tightness symptoms.  He denies any nighttime awakenings.  He has not had any ED or urgent care visits or any systemic steroid needs since the last visit. He continues to avoid fish and shellfish without any accidental ingestions or need to use his epinephrine device. He states the skin has been doing quite well without eczema flares.  He does not moisturize during the winter times.  He states he usually will moisturize more so in the summer when he has more of his skin exposed.  Review of systems: Review of Systems  Constitutional: Negative.   HENT: Negative.    Eyes: Negative.   Respiratory:  Positive for chest tightness.   Cardiovascular: Negative.   Musculoskeletal: Negative.   Skin: Negative.   Allergic/Immunologic: Negative.   Neurological: Negative.      All other systems negative unless noted above in HPI  Past medical/social/surgical/family history have been reviewed and are unchanged unless specifically indicated below.  No changes  Medication List: Current Outpatient Medications  Medication Sig Dispense Refill   albuterol (VENTOLIN HFA) 108 (90 Base) MCG/ACT inhaler Inhale 2 puffs into the lungs every 6 (six) hours as needed for wheezing or shortness of breath. 18 g 5   amLODipine (NORVASC) 5 MG tablet Take 1 tablet (5 mg total) by mouth daily. 90 tablet 1    losartan (COZAAR) 50 MG tablet TAKE 1 TABLET BY MOUTH EVERY DAY 90 tablet 1   fluticasone (FLOVENT HFA) 220 MCG/ACT inhaler 2 puffs twice a day. (Patient not taking: Reported on 03/01/2022) 12 g 2   No current facility-administered medications for this visit.     Known medication allergies: No Known Allergies   Physical examination: Blood pressure (!) 142/94, pulse 84, temperature 98.7 F (37.1 C), resp. rate 16, weight 253 lb (114.8 kg), SpO2 97 %.  General: Alert, interactive, in no acute distress. HEENT: PERRLA, TMs pearly gray, turbinates minimally edematous without discharge, post-pharynx non erythematous. Neck: Supple without lymphadenopathy. Lungs: Clear to auscultation without wheezing, rhonchi or rales. {no increased work of breathing. CV: Normal S1, S2 without murmurs. Abdomen: Nondistended, nontender. Skin: Warm and dry, without lesions or rashes. Extremities:  No clubbing, cyanosis or edema. Neuro:   Grossly intact.  Diagnositics/Labs: Spirometry: FEV1: 1.74L 46%, FVC: 3.313L 68% predicted.  This is essentially his baseline lung function.  It is stable  Assessment and plan: Food allergy -continue avoidance of fish and shellfish -if you become interested in determining if you are not fish allergic then would recommend you have serum IgE levels for fish/shellfish done.  This is labwork and you can return to our [place] office during office hours to lab draw.   -have access to self-injectable epinephrine (Epipen or AuviQ) 0.3mg  at all times -follow emergency action plan in case of allergic reaction  Moderate persistent asthma  not under good control -stop QVAR -start Symbicort 2 puffs twice a day. This is a maintenance controller medications to decrease asthma symptoms and decrease need to use your rescue albuterol inhaler.   This inhaler can also be used a rescue inhaler (like albuterol) and can use 2 puffs as needed.  Max dosing is 10 puffs/day -have access to  albuterol inhaler 2 puffs every 4-6 hours as needed for cough/wheeze/shortness of breath/chest tightness.  May use 15-20 minutes prior to activity.   Monitor frequency of use.    Asthma control goals:  Full participation in all desired activities (may need albuterol before activity) Albuterol use two time or less a week on average (not counting use with activity) Cough interfering with sleep two time or less a month Oral steroids no more than once a year No hospitalizations  Eczema -continue moisturization daily and especially after bathing with lotion like Eucerin  Follow-up in 6 months or sooner if needed    I appreciate the opportunity to take part in William Zimmerman's care. Please do not hesitate to contact me with questions.  Sincerely,   [name], MD Allergy/Immunology [facility]

## 2022-03-03 ENCOUNTER — Other Ambulatory Visit: Payer: Self-pay | Admitting: Family Medicine

## 2022-03-03 DIAGNOSIS — I10 Essential (primary) hypertension: Secondary | ICD-10-CM

## 2022-04-05 ENCOUNTER — Other Ambulatory Visit: Payer: Self-pay | Admitting: Allergy

## 2022-04-05 NOTE — Telephone Encounter (Signed)
Patient usually sees Dr. Nelva Bush, but she is out of the office. Please advise on inhaler change. Both generic and brand version of symbicort is not covered by the patient's insurance. Wixela and Memory Dance is covered inhalers.

## 2022-04-11 NOTE — Telephone Encounter (Signed)
I believe the rx that was sent on 1/4 failed to escribe- I tried resending and it failed again so I gave a verbal order and it went through for $5.

## 2022-04-11 NOTE — Telephone Encounter (Signed)
Please advise to change form symbicort to wixela thank you

## 2022-05-01 ENCOUNTER — Other Ambulatory Visit: Payer: Self-pay | Admitting: Family Medicine

## 2022-05-01 DIAGNOSIS — I10 Essential (primary) hypertension: Secondary | ICD-10-CM

## 2022-05-01 DIAGNOSIS — J4520 Mild intermittent asthma, uncomplicated: Secondary | ICD-10-CM

## 2022-06-11 ENCOUNTER — Ambulatory Visit (INDEPENDENT_AMBULATORY_CARE_PROVIDER_SITE_OTHER): Payer: BC Managed Care – PPO | Admitting: Family Medicine

## 2022-06-11 ENCOUNTER — Encounter: Payer: Self-pay | Admitting: Family Medicine

## 2022-06-11 VITALS — BP 120/90 | HR 93 | Temp 98.3°F | Resp 18 | Ht 71.0 in | Wt 245.6 lb

## 2022-06-11 DIAGNOSIS — I10 Essential (primary) hypertension: Secondary | ICD-10-CM

## 2022-06-11 DIAGNOSIS — E785 Hyperlipidemia, unspecified: Secondary | ICD-10-CM

## 2022-06-11 DIAGNOSIS — Z0000 Encounter for general adult medical examination without abnormal findings: Secondary | ICD-10-CM

## 2022-06-11 LAB — LIPID PANEL
Cholesterol: 205 mg/dL — ABNORMAL HIGH (ref 0–200)
HDL: 67.3 mg/dL (ref >39.00)
LDL Cholesterol: 115 mg/dL — ABNORMAL HIGH (ref 0–99)
NonHDL: 137.23
Total CHOL/HDL Ratio: 3
Triglycerides: 110 mg/dL (ref 0.0–149.0)
VLDL: 22 mg/dL (ref 0.0–40.0)

## 2022-06-11 LAB — CBC WITH DIFFERENTIAL/PLATELET
Basophils Absolute: 0 10*3/uL (ref 0.0–0.1)
Basophils Relative: 0.5 % (ref 0.0–3.0)
Eosinophils Absolute: 0.1 10*3/uL (ref 0.0–0.7)
Eosinophils Relative: 2.3 % (ref 0.0–5.0)
HCT: 46.8 % (ref 39.0–52.0)
Hemoglobin: 15.6 g/dL (ref 13.0–17.0)
Lymphocytes Relative: 32.4 % (ref 12.0–46.0)
Lymphs Abs: 1.5 10*3/uL (ref 0.7–4.0)
MCHC: 33.3 g/dL (ref 30.0–36.0)
MCV: 82.6 fl (ref 78.0–100.0)
Monocytes Absolute: 0.3 10*3/uL (ref 0.1–1.0)
Monocytes Relative: 5.6 % (ref 3.0–12.0)
Neutro Abs: 2.8 10*3/uL (ref 1.4–7.7)
Neutrophils Relative %: 59.2 % (ref 43.0–77.0)
Platelets: 303 10*3/uL (ref 150.0–400.0)
RBC: 5.66 Mil/uL (ref 4.22–5.81)
RDW: 14.5 % (ref 11.5–15.5)
WBC: 4.7 10*3/uL (ref 4.0–10.5)

## 2022-06-11 LAB — TSH: TSH: 0.64 u[IU]/mL (ref 0.35–5.50)

## 2022-06-11 LAB — COMPREHENSIVE METABOLIC PANEL
ALT: 34 U/L (ref 0–53)
AST: 27 U/L (ref 0–37)
Albumin: 4.2 g/dL (ref 3.5–5.2)
Alkaline Phosphatase: 55 U/L (ref 39–117)
BUN: 13 mg/dL (ref 6–23)
CO2: 28 mEq/L (ref 19–32)
Calcium: 9.7 mg/dL (ref 8.4–10.5)
Chloride: 102 mEq/L (ref 96–112)
Creatinine, Ser: 1.07 mg/dL (ref 0.40–1.50)
GFR: 88.13 mL/min (ref >60.00)
Glucose, Bld: 99 mg/dL (ref 70–99)
Potassium: 4.7 mEq/L (ref 3.5–5.1)
Sodium: 138 mEq/L (ref 135–145)
Total Bilirubin: 0.5 mg/dL (ref 0.2–1.2)
Total Protein: 6.9 g/dL (ref 6.0–8.3)

## 2022-06-11 MED ORDER — AMLODIPINE BESYLATE 5 MG PO TABS: 5.0000 mg | ORAL_TABLET | Freq: Every day | ORAL | 3 refills | Status: DC

## 2022-06-11 MED ORDER — LOSARTAN POTASSIUM 50 MG PO TABS: 50.0000 mg | ORAL_TABLET | Freq: Every day | ORAL | 3 refills | Status: DC

## 2022-06-11 NOTE — Patient Instructions (Signed)
Preventive Care 21-39 Years Old, Male Preventive care refers to lifestyle choices and visits with your health care provider that can promote health and wellness. Preventive care visits are also called wellness exams. What can I expect for my preventive care visit? Counseling During your preventive care visit, your health care provider may ask about your: Medical history, including: Past medical problems. Family medical history. Current health, including: Emotional well-being. Home life and relationship well-being. Sexual activity. Lifestyle, including: Alcohol, nicotine or tobacco, and drug use. Access to firearms. Diet, exercise, and sleep habits. Safety issues such as seatbelt and bike helmet use. Sunscreen use. Work and work environment. Physical exam Your health care provider may check your: Height and weight. These may be used to calculate your BMI (body mass index). BMI is a measurement that tells if you are at a healthy weight. Waist circumference. This measures the distance around your waistline. This measurement also tells if you are at a healthy weight and may help predict your risk of certain diseases, such as type 2 diabetes and high blood pressure. Heart rate and blood pressure. Body temperature. Skin for abnormal spots. What immunizations do I need?  Vaccines are usually given at various ages, according to a schedule. Your health care provider will recommend vaccines for you based on your age, medical history, and lifestyle or other factors, such as travel or where you work. What tests do I need? Screening Your health care provider may recommend screening tests for certain conditions. This may include: Lipid and cholesterol levels. Diabetes screening. This is done by checking your blood sugar (glucose) after you have not eaten for a while (fasting). Hepatitis B test. Hepatitis C test. HIV (human immunodeficiency virus) test. STI (sexually transmitted infection)  testing, if you are at risk. Talk with your health care provider about your test results, treatment options, and if necessary, the need for more tests. Follow these instructions at home: Eating and drinking  Eat a healthy diet that includes fresh fruits and vegetables, whole grains, lean protein, and low-fat dairy products. Drink enough fluid to keep your urine pale yellow. Take vitamin and mineral supplements as recommended by your health care provider. Do not drink alcohol if your health care provider tells you not to drink. If you drink alcohol: Limit how much you have to 0-2 drinks a day. Know how much alcohol is in your drink. In the [place] one drink equals one 12 oz bottle of beer (355 mL), one 5 oz glass of wine (148 mL), or one 1 oz glass of hard liquor (44 mL). Lifestyle Brush your teeth every morning and night with fluoride toothpaste. Floss one time each day. Exercise for at least 30 minutes 5 or more days each week. Do not use any products that contain nicotine or tobacco. These products include cigarettes, chewing tobacco, and vaping devices, such as e-cigarettes. If you need help quitting, ask your health care provider. Do not use drugs. If you are sexually active, practice safe sex. Use a condom or other form of protection to prevent STIs. Find healthy ways to manage stress, such as: Meditation, yoga, or listening to music. Journaling. Talking to a trusted person. Spending time with friends and family. Minimize exposure to UV radiation to reduce your risk of skin cancer. Safety Always wear your seat belt while driving or riding in a vehicle. Do not drive: If you have been drinking alcohol. Do not ride with someone who has been drinking. If you have been using any mind-altering substances   or drugs. While texting. When you are tired or distracted. Wear a helmet and other protective equipment during sports activities. If you have firearms in your house, make sure you  follow all gun safety procedures. Seek help if you have been physically or sexually abused. What's next? Go to your health care provider once a year for an annual wellness visit. Ask your health care provider how often you should have your eyes and teeth checked. Stay up to date on all vaccines. This information is not intended to replace advice given to you by your health care provider. Make sure you discuss any questions you have with your health care provider. Document Revised: 09/14/2020 Document Reviewed: 09/14/2020 Elsevier Patient Education  2023 [employer]  

## 2022-06-11 NOTE — Assessment & Plan Note (Signed)
Ghm utd  Check labs  

## 2022-06-11 NOTE — Assessment & Plan Note (Signed)
Encourage heart healthy diet such as MIND or DASH diet, increase exercise, avoid trans fats, simple carbohydrates and processed foods, consider a krill or fish or flaxseed oil cap daily.  °

## 2022-06-11 NOTE — Progress Notes (Addendum)
Subjective:   By signing my name below, I, William Zimmerman, attest that this documentation has been prepared under the direction and in the presence of [name], DO. 06/11/2022   Patient ID: William Zimmerman, male    DOB: 05/21/1985, 37 y.o.   MRN: [id number]  Chief Complaint  Patient presents with   Annual Exam    Pt states fasting     HPI Patient is in today for a comprehensive physical exam.   Flatulence  He complains of having sporadic flatulence. He denies any other GI or GU issues or symptoms.  Cardiology He is interested in receiving an ECG during this visit. He denies having any chest pain or palpitations.   Losartan He is requesting a refill for 50 mg losartan during this visit.   Blood pressure He is taking 5 mg amlodipine daily PO to manage his blood pressure. His diastolic blood pressure is elevated during this visit.  BP Readings from Last 3 Encounters:  06/11/22 (!) 120/90  03/01/22 (!) 142/94  07/12/21 130/88    Pulse Readings from Last 3 Encounters:  06/11/22 93  03/01/22 84  07/12/21 73    Breathing His allergies are worsening due to seasonal changes, but he has not had to manage his asthma as much as previously. He uses albuterol as needed to manage his symptoms.   Social history: No changes in family history.   Immunizations: He denies having any reason to need to receive a HIV screening.    Past Medical History:  Diagnosis Date   Asthma    Eczema    Seasonal allergies     No past surgical history on file.  Family History  Problem Relation Age of Onset   Diabetes Mother    Arthritis Mother    Hypertension Mother    Hypertension Father    Diabetes Maternal Grandfather    Arthritis Maternal Grandfather     Social History   Socioeconomic History   Marital status: Married    Spouse name: Not on file   Number of children: Not on file   Years of education: Not on file   Highest education level: Not on file  Occupational  History   Not on file  Tobacco Use   Smoking status: Never    Passive exposure: Never   Smokeless tobacco: Never   Tobacco comments:    Daily marijuana use   Vaping Use   Vaping Use: Never used  Substance and Sexual Activity   Alcohol use: Yes    Alcohol/week: 4.0 standard drinks of alcohol    Types: 4 Cans of beer per week    Comment: uses on the weekends only   Drug use: Not Currently    Types: Marijuana    Comment: Stopped 05/2021   Sexual activity: Yes    Partners: Female  Other Topics Concern   Not on file  Social History Narrative   Exercise 3 days a week    Social Determinants of Health   Financial Resource Strain: Not on file  Food Insecurity: Not on file  Transportation Needs: Not on file  Physical Activity: Not on file  Stress: Not on file  Social Connections: Not on file  Intimate Partner Violence: Not on file    Outpatient Medications Prior to Visit  Medication Sig Dispense Refill   albuterol (VENTOLIN HFA) 108 (90 Base) MCG/ACT inhaler TAKE 2 PUFFS BY MOUTH EVERY 6 HOURS AS NEEDED FOR WHEEZE OR SHORTNESS OF BREATH 8.5  each 5   fluticasone-salmeterol (WIXELA INHUB) 250-50 MCG/ACT AEPB Inhale 1 puff into the lungs in the morning and at bedtime. 60 each 5   amLODipine (NORVASC) 5 MG tablet TAKE 1 TABLET (5 MG TOTAL) BY MOUTH DAILY. 90 tablet 1   losartan (COZAAR) 50 MG tablet TAKE 1 TABLET BY MOUTH EVERY DAY 90 tablet 1   No facility-administered medications prior to visit.    No Known Allergies  Review of Systems  Constitutional:  Negative for chills, fever and malaise/fatigue.  HENT:  Negative for congestion and hearing loss.   Eyes:  Negative for blurred vision and discharge.  Respiratory:  Negative for cough, sputum production and shortness of breath.   Cardiovascular:  Negative for chest pain, palpitations and leg swelling.  Gastrointestinal:  Negative for abdominal pain, blood in stool, constipation, diarrhea, heartburn, nausea and vomiting.   Genitourinary:  Negative for dysuria, frequency, hematuria and urgency.  Musculoskeletal:  Negative for back pain, falls and myalgias.  Skin:  Negative for rash.  Neurological:  Negative for dizziness, sensory change, loss of consciousness, weakness and headaches.  Endo/Heme/Allergies:  Negative for environmental allergies. Does not bruise/bleed easily.  Psychiatric/Behavioral:  Negative for depression and suicidal ideas. The patient is not nervous/anxious and does not have insomnia.        Objective:    Physical Exam Vitals and nursing note reviewed.  Constitutional:      General: He is not in acute distress.    Appearance: Normal appearance. He is well-developed. He is not diaphoretic.  HENT:     Head: Normocephalic and atraumatic.     Right Ear: External ear normal.     Left Ear: External ear normal.     Nose: Nose normal.     Mouth/Throat:     Pharynx: No oropharyngeal exudate.  Eyes:     General:        Right eye: No discharge.        Left eye: No discharge.     Conjunctiva/sclera: Conjunctivae normal.     Pupils: Pupils are equal, round, and reactive to light.  Neck:     Thyroid: No thyromegaly.     Vascular: No JVD.  Cardiovascular:     Rate and Rhythm: Normal rate and regular rhythm.     Heart sounds: Normal heart sounds. No murmur heard.    No gallop.  Pulmonary:     Effort: Pulmonary effort is normal. No respiratory distress.     Breath sounds: Normal breath sounds. No wheezing or rales.  Chest:     Chest wall: No tenderness.  Abdominal:     General: Bowel sounds are normal. There is no distension.     Palpations: Abdomen is soft. There is no mass.     Tenderness: There is no abdominal tenderness. There is no guarding or rebound.  Musculoskeletal:        General: No tenderness. Normal range of motion.     Cervical back: Normal range of motion and neck supple.     Comments: (+) 5/5 strength in upper and lower extremities  Lymphadenopathy:     Cervical: No  cervical adenopathy.  Skin:    General: Skin is warm and dry.     Findings: No erythema or rash.  Neurological:     General: No focal deficit present.     Mental Status: He is alert and oriented to person, place, and time.     Cranial Nerves: No cranial nerve deficit.  Motor: No abnormal muscle tone.     Deep Tendon Reflexes: Reflexes are normal and symmetric. Reflexes normal.  Psychiatric:        Behavior: Behavior normal.        Thought Content: Thought content normal.        Judgment: Judgment normal.     BP (!) 120/90 (BP Location: Right Arm, Patient Position: Sitting, Cuff Size: Large)   Pulse 93   Temp 98.3 F (36.8 C) (Oral)   Resp 18   Ht '5\' 11"'$  (1.803 m)   Wt 245 lb 9.6 oz (111.4 kg)   SpO2 97%   BMI 34.25 kg/m  Wt Readings from Last 3 Encounters:  06/11/22 245 lb 9.6 oz (111.4 kg)  03/01/22 253 lb (114.8 kg)  07/12/21 228 lb (103.4 kg)       Assessment & Plan:  Preventative health care Assessment & Plan: Ghm utd Check labs   Orders: -     Lipid panel -     TSH -     Comprehensive metabolic panel -     CBC with Differential/Platelet  Primary hypertension Assessment & Plan: Well controlled, no changes to meds. Encouraged heart healthy diet such as the DASH diet and exercise as tolerated.    Orders: -     Losartan Potassium; Take 1 tablet (50 mg total) by mouth daily.  Dispense: 90 tablet; Refill: 3 -     amLODIPine Besylate; Take 1 tablet (5 mg total) by mouth daily.  Dispense: 90 tablet; Refill: 3 -     EKG 12-Lead -     ECHOCARDIOGRAM COMPLETE; Future  Hyperlipidemia LDL goal <100 Assessment & Plan: Encourage heart healthy diet such as MIND or DASH diet, increase exercise, avoid trans fats, simple carbohydrates and processed foods, consider a krill or fish or flaxseed oil cap daily.       IAnn Held, DO, personally preformed the services described in this documentation.  All medical record entries made by the scribe were at my  direction and in my presence.  I have reviewed the chart and discharge instructions (if applicable) and agree that the record reflects my personal performance and is accurate and complete. 06/11/2022  [place] as a scribe for [name], DO.,have documented all relevant documentation on the behalf of [name], DO,as directed by  [name], DO while in the presence of [name], DO.

## 2022-06-11 NOTE — Assessment & Plan Note (Signed)
Well controlled, no changes to meds. Encouraged heart healthy diet such as the DASH diet and exercise as tolerated.  °

## 2022-07-09 ENCOUNTER — Ambulatory Visit (HOSPITAL_COMMUNITY)
Admission: RE | Admit: 2022-07-09 | Discharge: 2022-07-09 | Disposition: A | Discharge status: Home / Self Care | Payer: BC Managed Care – PPO | Source: Ambulatory Visit | Attending: Family Medicine | Admitting: Family Medicine

## 2022-07-09 DIAGNOSIS — R9431 Abnormal electrocardiogram [ECG] [EKG]: Secondary | ICD-10-CM | POA: Insufficient documentation

## 2022-07-09 DIAGNOSIS — I10 Essential (primary) hypertension: Secondary | ICD-10-CM | POA: Insufficient documentation

## 2022-07-09 DIAGNOSIS — I082 Rheumatic disorders of both aortic and tricuspid valves: Secondary | ICD-10-CM | POA: Insufficient documentation

## 2022-07-09 DIAGNOSIS — E785 Hyperlipidemia, unspecified: Secondary | ICD-10-CM | POA: Insufficient documentation

## 2022-07-09 LAB — ECHOCARDIOGRAM COMPLETE
Area-P 1/2: 4.15 cm2
S' Lateral: 2.8 cm

## 2022-07-10 ENCOUNTER — Other Ambulatory Visit: Payer: Self-pay | Admitting: Family Medicine

## 2022-07-10 DIAGNOSIS — J4520 Mild intermittent asthma, uncomplicated: Secondary | ICD-10-CM

## 2022-07-11 ENCOUNTER — Telehealth: Payer: Self-pay | Admitting: Family Medicine

## 2022-07-11 NOTE — Telephone Encounter (Signed)
Pt called stating that he is unable to pick up a refill for this albuterol inhaler. Pt stated that the pharmacy didn't go into detail regarding why they are unable to fill it. After reviewing chart, noticed that the pharmacy sent in a request for a different inhaler. Unsure if pharmacy is out of stock of medication or if it was a change in coverage for that medication.

## 2022-07-13 NOTE — Telephone Encounter (Signed)
Patient called back stating his insurance wont cover his inhaler and a new one needs to be sent. Please advice.

## 2022-07-13 NOTE — Telephone Encounter (Signed)
Pt followed up to advise that pharmacy is still not letting him fill his albuterol. Advised that he has refills. Pt said the pharmacy has not given him a reason why and just said they sent something to [place] to change the prescription. Please follow up to see what the issue might be.

## 2022-07-14 ENCOUNTER — Telehealth: Payer: Self-pay | Admitting: Family Medicine

## 2022-07-14 DIAGNOSIS — J4520 Mild intermittent asthma, uncomplicated: Secondary | ICD-10-CM

## 2022-07-14 MED ORDER — ALBUTEROL SULFATE HFA 108 (90 BASE) MCG/ACT IN AERS: INHALATION_SPRAY | RESPIRATORY_TRACT | 0 refills | Status: DC

## 2022-07-14 NOTE — Telephone Encounter (Signed)
Call rec on call  Pt needs his albuterol inhaler sent in   Done  ? If there was an issue with insurance originally   Will cc pcp

## 2022-07-16 ENCOUNTER — Telehealth: Payer: Self-pay

## 2022-07-16 MED ORDER — LEVALBUTEROL TARTRATE 45 MCG/ACT IN AERO: 2.0000 | INHALATION_SPRAY | Freq: Four times a day (QID) | RESPIRATORY_TRACT | 5 refills | Status: DC | PRN

## 2022-07-16 NOTE — Telephone Encounter (Signed)
See telephone note from Dr. Milinda Antis.

## 2022-07-16 NOTE — Telephone Encounter (Signed)
Levalbuterol covered by plan instead of albuterol. Rx sent

## 2022-07-16 NOTE — Telephone Encounter (Signed)
Initial Comment Caller needs their RX changed because the one they got is not covered under their insurance. They added that they are having an asthma attacck/have had one. Translation No Nurse Assessment Nurse: Kizzie Bane, RN, Marylene Land Date/Time William Zimmerman Time): 07/14/2022 1:06:24 PM Confirm and document reason for call. If symptomatic, describe symptoms. ---Caller states he is having difficulty breathing for the past week. No fever Does the patient have any new or worsening symptoms? ---Yes Will a triage be completed? ---Yes Related visit to physician within the last 2 weeks? ---No Does the PT have any chronic conditions? (i.e. diabetes, asthma, this includes High risk factors for pregnancy, etc.) ---Yes List chronic conditions. ---asthma, hypertension Is this a behavioral health or substance abuse call? ---No Disp. Time William Zimmerman Time) Disposition Final User 07/14/2022 1:04:28 PM Send to Urgent Queue William Zimmerman 07/14/2022 1:16:00 PM Clinical Call Yes Kizzie Bane RN, Marylene Land Final Disposition 07/14/2022 1:16:00 PM Clinical Call Yes Kizzie Bane, RN, Marylene Land PLEASE NOTE: All timestamps contained within this report are represented as [place] Standard Time. CONFIDENTIALTY NOTICE: This fax transmission is intended only for the addressee. It contains information that is legally privileged, confidential or otherwise protected from use or disclosure. If you are not the intended recipient, you are strictly prohibited from reviewing, disclosing, copying using or disseminating any of this information or taking any action in reliance on or regarding this information. If you have received this fax in error, please notify us immediately by telephone so that we can arrange for its return to [place]. Phone: [phone], Toll-Free: [phone], Fax: [phone] Page: 2 of 2 Call Id: [id number] Comments User: William Quan, RN Date/Time William Zimmerman Time): 07/14/2022 1:15:43 PM After going through the triage questions  for difficulty breathing the patient answered no to all questions and there was no triage outcome d/t the patient has no symptoms. When I asked if he was having symptoms after triage he stated he was not having any symptoms and stated he just needs a refill for his inhaler and he has been trying to get the refill all week. I advised him to call the office on Monday when they open. He hung up on me

## 2022-07-16 NOTE — Telephone Encounter (Signed)
See after hours calls from weekend.

## 2022-07-16 NOTE — Telephone Encounter (Signed)
Initial Comment The caller states he needs his prescription changed because it is not covered by his insurance. Symptoms include trouble breathing Translation No Nurse Assessment Nurse: Arther Dames, RN, [place] Date/Time (Eastern Time): 07/14/2022 12:04:50 PM Confirm and document reason for call. If symptomatic, describe symptoms. ---Caller states he needs his prescription changed because it is not covered by his insurance. Symptoms include trouble breathing. He started having difficulty breathing for about a week. He is out of his albuterol. Does the patient have any new or worsening symptoms? ---Yes Will a triage be completed? ---Yes Related visit to physician within the last 2 weeks? ---No Does the PT have any chronic conditions? (i.e. diabetes, asthma, this includes High risk factors for pregnancy, etc.) ---Yes List chronic conditions. ---asthma, HTN Is this a behavioral health or substance abuse call? ---No Nurse: Arther Dames, RN, Benetta Spar Date/Time (Eastern Time): 07/14/2022 12:12:05 PM Please select the assessment type ---Refill Does the patient have enough medication to last until the office opens? ---Unable to obtain loaner dose from Pharmacy Does the client directives allow for assistance with medications after hours? ---Yes Was the medication filled within the last 6 months? ---Yes What is the name of the medication, dose and instructions as listed on the bottle? ---Albuterol PLEASE NOTE: All timestamps contained within this report are represented as [place] Standard Time. CONFIDENTIALTY NOTICE: This fax transmission is intended only for the addressee. It contains information that is legally privileged, confidential or otherwise protected from use or disclosure. If you are not the intended recipient, you are strictly prohibited from reviewing, disclosing, copying using or disseminating any of this information or taking any action in reliance on or regarding this information. If  you have received this fax in error, please notify us immediately by telephone so that we can arrange for its return to [place]. Phone: [phone], Toll-Free: [phone], Fax: [phone] Page: 2 of 3 Call Id: [id number] Nurse Assessment Pharmacy name and phone number where most recently filled. ---[facility] Additional Documentation ---[facility] [id number] Guidelines Guideline Title Affirmed Question Affirmed Notes Nurse Date/Time Lamount Cohen Time) Breathing Difficulty [1] MILD longstanding difficulty breathing AND [2] SAME as normal Arther Dames, RN, [place] 07/14/2022 12:07:25 PM Disp. Time Lamount Cohen Time) Disposition Final User 07/14/2022 12:04:02 PM Send to Urgent Nelly Laurence 07/14/2022 12:18:16 PM Paged On Call back to [facility], RN, [place] 07/14/2022 12:18:30 PM See PCP within 2 Weeks Yes Arther Dames, RN, [place] 07/14/2022 12:19:00 PM Paged On Call back to Call Center Arther Dames, RN, [place] Final Disposition 07/14/2022 12:18:30 PM See PCP within 2 Weeks Yes Arther Dames, RN, Matthew Saras Disagree/Comply Comply Caller Understands Yes PreDisposition Go to ED Care Advice Given Per Guideline SEE PCP WITHIN 2 WEEKS: * You need to be seen for this ongoing problem within the next 2 weeks. * PCP VISIT: Call your doctor (or NP/PA) during regular office hours and make an appointment. * IF PATIENT HAS NO PCP: A primary care clinic is where you need to be seen for chronic health problems. NOTE: Try to help caller find a PCP (e.g., use a physician referral line). Having a PCP or 'medical home' means better long-term care. GENERAL CARE ADVICE FOR BREATHING DIFFICULTY: * Find position of greatest comfort. For most patients the best position is semi-upright (e.g., sitting up in a comfortable chair or lying back against pillows). * Elevate head of bed (e.g., use pillows or place blocks under bed). * Avoid smoke or fume exposure. CALL BACK IF: * Severe difficulty breathing occurs * Fever  more than 100.4 F (38.0  C) * You become worse CARE ADVICE given per Breathing Difficulty (Adult) guideline.

## 2022-07-16 NOTE — Addendum Note (Signed)
Addended byConrad Colona D on: 07/16/2022 04:35 PM   Modules accepted: Orders

## 2022-07-16 NOTE — Telephone Encounter (Signed)
Pt called to go over the same thing.   He stated the albuterol brand leve is covered under his ins.    [id number] [place], [place] - [address], [place] [address] Phone: [phone]  Fax: [phone]

## 2022-07-23 NOTE — Telephone Encounter (Signed)
Inhaler has been switched to [place], is PA still needed? Thanks

## 2022-08-11 ENCOUNTER — Encounter (HOSPITAL_BASED_OUTPATIENT_CLINIC_OR_DEPARTMENT_OTHER): Payer: Self-pay | Admitting: Emergency Medicine

## 2022-08-11 ENCOUNTER — Emergency Department (HOSPITAL_BASED_OUTPATIENT_CLINIC_OR_DEPARTMENT_OTHER)
Admission: EM | Admit: 2022-08-11 | Discharge: 2022-08-11 | Disposition: A | Discharge status: Home / Self Care | Payer: BC Managed Care – PPO | Attending: Emergency Medicine | Admitting: Emergency Medicine

## 2022-08-11 ENCOUNTER — Other Ambulatory Visit: Payer: Self-pay | Admitting: Allergy

## 2022-08-11 ENCOUNTER — Other Ambulatory Visit: Payer: Self-pay

## 2022-08-11 DIAGNOSIS — J4541 Moderate persistent asthma with (acute) exacerbation: Secondary | ICD-10-CM | POA: Diagnosis not present

## 2022-08-11 DIAGNOSIS — R062 Wheezing: Secondary | ICD-10-CM | POA: Diagnosis present

## 2022-08-11 HISTORY — DX: Essential (primary) hypertension: I10

## 2022-08-11 MED ORDER — ALBUTEROL SULFATE (2.5 MG/3ML) 0.083% IN NEBU
2.5000 mg | INHALATION_SOLUTION | Freq: Once | RESPIRATORY_TRACT | Status: AC
  Administered 2022-08-11: 2.5 mg via RESPIRATORY_TRACT
  Filled 2022-08-11: qty 3

## 2022-08-11 MED ORDER — ALBUTEROL SULFATE HFA 108 (90 BASE) MCG/ACT IN AERS
2.0000 | INHALATION_SPRAY | RESPIRATORY_TRACT | Status: DC | PRN
  Administered 2022-08-11: 2 via RESPIRATORY_TRACT

## 2022-08-11 MED ORDER — ALBUTEROL SULFATE HFA 108 (90 BASE) MCG/ACT IN AERS
2.0000 | INHALATION_SPRAY | RESPIRATORY_TRACT | Status: DC | PRN
  Filled 2022-08-11: qty 6.7

## 2022-08-11 MED ORDER — IPRATROPIUM-ALBUTEROL 0.5-2.5 (3) MG/3ML IN SOLN
3.0000 mL | Freq: Once | RESPIRATORY_TRACT | Status: AC
  Administered 2022-08-11: 3 mL via RESPIRATORY_TRACT
  Filled 2022-08-11: qty 3

## 2022-08-11 MED ORDER — PREDNISONE 10 MG (21) PO TBPK: ORAL_TABLET | ORAL | 0 refills | Status: DC

## 2022-08-11 NOTE — ED Notes (Signed)
Reviewed AVS with patient, patient expressed understanding of directions, denies further questions at this time. 

## 2022-08-11 NOTE — ED Triage Notes (Signed)
Pt c/o shob from asthma x 3 days. Pt has been using his inhaler without relief. RT at bedside during triage

## 2022-08-11 NOTE — ED Notes (Signed)
Pt educated on proper use of MDI w/spacer. Pt able to perform without difficulty.

## 2022-08-11 NOTE — ED Provider Notes (Signed)
[place] EMERGENCY DEPARTMENT AT [facility]  Provider Note  CSN: [id number] Arrival date & time: 08/11/22 0320  History Chief Complaint  Patient presents with   Asthma    William Zimmerman is a 37 y.o. male with history of asthma, reports 2 days of wheezing and tightness, not improved with home inhaler (Flovent and Xopanex). No recent ED visits, admissions or oral steroids. Given a neb prior to my evaluation with good improvement.    Home Medications Prior to Admission medications   Medication Sig Start Date End Date Taking? Authorizing Provider  predniSONE (STERAPRED UNI-PAK 21 TAB) 10 MG (21) TBPK tablet 10mg  Tabs, 6 day taper. Use as directed 08/11/22  Yes Pollyann Savoy, MD  amLODipine (NORVASC) 5 MG tablet Take 1 tablet (5 mg total) by mouth daily. 06/11/22   Zola Button, Yvonne R, DO  fluticasone-salmeterol (WIXELA INHUB) 250-50 MCG/ACT AEPB Inhale 1 puff into the lungs in the morning and at bedtime. 04/11/22   Marcelyn Bruins, MD  levalbuterol [facility] HFA) 45 MCG/ACT inhaler Inhale 2 puffs into the lungs every 6 (six) hours as needed for wheezing or shortness of breath. 07/16/22   Donato Schultz, DO  losartan (COZAAR) 50 MG tablet Take 1 tablet (50 mg total) by mouth daily. 06/11/22   Donato Schultz, DO     Allergies    Patient has no known allergies.   Review of Systems   Review of Systems Please see HPI for pertinent positives and negatives  Physical Exam BP (!) 143/89   Pulse 73   Temp 97.8 F (36.6 C) (Temporal)   Resp 19   Ht 5\' 11"  (1.803 m)   Wt 111.4 kg   SpO2 96%   BMI 34.25 kg/m   Physical Exam Vitals and nursing note reviewed.  Constitutional:      Appearance: Normal appearance.  HENT:     Head: Normocephalic and atraumatic.     Nose: Nose normal.     Mouth/Throat:     Mouth: Mucous membranes are moist.  Eyes:     Extraocular Movements: Extraocular movements intact.     Conjunctiva/sclera: Conjunctivae  normal.  Cardiovascular:     Rate and Rhythm: Normal rate.  Pulmonary:     Effort: Pulmonary effort is normal.     Breath sounds: Normal breath sounds.  Abdominal:     General: Abdomen is flat.     Palpations: Abdomen is soft.     Tenderness: There is no abdominal tenderness.  Musculoskeletal:        General: No swelling. Normal range of motion.     Cervical back: Neck supple.  Skin:    General: Skin is warm and dry.  Neurological:     General: No focal deficit present.     Mental Status: He is alert.  Psychiatric:        Mood and Affect: Mood normal.     ED Results / Procedures / Treatments   EKG None  Procedures Procedures  Medications Ordered in the ED Medications  albuterol (VENTOLIN HFA) 108 (90 Base) MCG/ACT inhaler 2 puff (has no administration in time range)  albuterol (VENTOLIN HFA) 108 (90 Base) MCG/ACT inhaler 2 puff (has no administration in time range)  ipratropium-albuterol (DUONEB) 0.5-2.5 (3) MG/3ML nebulizer solution 3 mL (3 mLs Nebulization Given 08/11/22 0343)  albuterol (PROVENTIL) (2.5 MG/3ML) 0.083% nebulizer solution 2.5 mg (2.5 mg Nebulization Given 08/11/22 0343)    Initial Impression and Plan  Patient with  history of asthma here with exacerbation of same, feeling better after neb and comfortable managing symptoms at home. Plan HFA to use at home as needed. Pred-pak and PCP follow up. RTED for any other concerns.   ED Course       MDM Rules/Calculators/A&P Medical Decision Making Problems Addressed: Moderate persistent asthma with acute exacerbation: chronic illness or injury with exacerbation, progression, or side effects of treatment  Risk Prescription drug management.     Final Clinical Impression(s) / ED Diagnoses Final diagnoses:  Moderate persistent asthma with acute exacerbation    Rx / DC Orders ED Discharge Orders          Ordered    predniSONE (STERAPRED UNI-PAK 21 TAB) 10 MG (21) TBPK tablet        08/11/22 0433              Pollyann Savoy, MD 08/11/22 [phone]

## 2022-08-29 ENCOUNTER — Other Ambulatory Visit: Payer: Self-pay

## 2022-08-29 ENCOUNTER — Ambulatory Visit (INDEPENDENT_AMBULATORY_CARE_PROVIDER_SITE_OTHER): Payer: BC Managed Care – PPO | Admitting: Allergy

## 2022-08-29 ENCOUNTER — Encounter: Payer: Self-pay | Admitting: Allergy

## 2022-08-29 VITALS — BP 130/70 | HR 82 | Temp 98.3°F | Ht 71.0 in | Wt 244.9 lb

## 2022-08-29 DIAGNOSIS — J4540 Moderate persistent asthma, uncomplicated: Secondary | ICD-10-CM | POA: Diagnosis not present

## 2022-08-29 DIAGNOSIS — T7809XD Anaphylactic reaction due to other food products, subsequent encounter: Secondary | ICD-10-CM | POA: Diagnosis not present

## 2022-08-29 DIAGNOSIS — L2089 Other atopic dermatitis: Secondary | ICD-10-CM | POA: Diagnosis not present

## 2022-08-29 MED ORDER — ALBUTEROL SULFATE (2.5 MG/3ML) 0.083% IN NEBU: 2.5000 mg | INHALATION_SOLUTION | RESPIRATORY_TRACT | 1 refills | Status: DC | PRN

## 2022-08-29 MED ORDER — FLUTICASONE FUROATE-VILANTEROL 200-25 MCG/ACT IN AEPB: 1.0000 | INHALATION_SPRAY | Freq: Every day | RESPIRATORY_TRACT | 5 refills | Status: DC

## 2022-08-29 NOTE — Patient Instructions (Addendum)
-  continue avoidance of fish and shellfish -if you become interested in determining if you are not fish allergic then would recommend you have serum IgE levels for fish/shellfish done.  This is labwork and you can return to our [place] office during office hours to lab draw.   -have access to self-injectable epinephrine (Epipen or AuviQ) 0.3mg  at all times -follow emergency action plan in case of allergic reaction  -Breo inhaler looks to be covered with insurance.   Start Breo 1 puff once a day.  This is similar to Symbicort but is a powder inhaler.  It is a asthma controller inhaler -have access to albuterol inhaler 2 puffs or albuterol 1 vial via nebulizer every 4-6 hours as needed for cough/wheeze/shortness of breath/chest tightness.  May use 15-20 minutes prior to activity.   Monitor frequency of use.   -nebulizer provided today  Asthma control goals:  Full participation in all desired activities (may need albuterol before activity) Albuterol use two time or less a week on average (not counting use with activity) Cough interfering with sleep two time or less a month Oral steroids no more than once a year No hospitalizations   -continue moisturization daily and especially after bathing with lotion like Eucerin -recommend applying sunscreen for sun protection  Follow-up in 4-6 months or sooner if needed

## 2022-08-29 NOTE — Progress Notes (Signed)
Follow-up Note  RE: William Zimmerman MRN: [id number] DOB: 1985-11-17 Date of Office Visit: 08/29/2022   History of present illness: William Zimmerman is a 37 y.o. male presenting today for follow-up of asthma, eczema and food allergy.  He was last seen in the office on 03/01/2022 by myself.  Symbicort is no longer covered with insurance. He has been out of symbicort for at least 6 months now.  He can tell when he was using symbicort it was working.  He states he has needed to use albuterol as "spring was rough".  He has had an ED visit for his asthma on 08/11/22 for his asthma where he was treated with albuterol inhaler, DuoNeb via nebulizer.  He was discharged to complete a prednisone taper pack.  He said he has been having chest tightness.  He is interested in having a nebulizer as it did not seem to be effective in the emergency room.  He states his eczema seems to be a bit worse currently with the weather.  But moisturization still seems to be effective along.  He avoids fish and shellfish and has access to an epinephrine device.  Review of systems: Review of Systems  Constitutional: Negative.   HENT: Negative.    Eyes: Negative.   Respiratory:  Positive for cough.        See HPI  Cardiovascular: Negative.   Musculoskeletal: Negative.   Skin: Negative.   Allergic/Immunologic: Negative.   Neurological: Negative.      All other systems negative unless noted above in HPI  Past medical/social/surgical/family history have been reviewed and are unchanged unless specifically indicated below.  No changes  Medication List: Current Outpatient Medications  Medication Sig Dispense Refill   albuterol (PROVENTIL) (2.5 MG/3ML) 0.083% nebulizer solution Take 3 mLs (2.5 mg total) by nebulization every 4 (four) hours as needed for wheezing or shortness of breath. 75 mL 1   amLODipine (NORVASC) 5 MG tablet Take 1 tablet (5 mg total) by mouth daily. 90 tablet 3   fluticasone furoate-vilanterol  (BREO ELLIPTA) 200-25 MCG/ACT AEPB Inhale 1 puff into the lungs daily. 30 each 5   levalbuterol (XOPENEX HFA) 45 MCG/ACT inhaler Inhale 2 puffs into the lungs every 6 (six) hours as needed for wheezing or shortness of breath. 18 g 5   losartan (COZAAR) 50 MG tablet Take 1 tablet (50 mg total) by mouth daily. 90 tablet 3   No current facility-administered medications for this visit.     Known medication allergies: No Known Allergies   Physical examination: Blood pressure 130/70, pulse 82, temperature 98.3 F (36.8 C), height 5\' 11"  (1.803 m), weight 244 lb 14.4 oz (111.1 kg), SpO2 96 %.  General: Alert, interactive, in no acute distress. HEENT: PERRLA, TMs pearly gray, turbinates minimally edematous without discharge, post-pharynx non erythematous. Neck: Supple without lymphadenopathy. Lungs: Mildly decreased breath sounds bilaterally without wheezing, rhonchi or rales. {no increased work of breathing. CV: Normal S1, S2 without murmurs. Abdomen: Nondistended, nontender. Skin: Warm and dry, without lesions or rashes. Extremities:  No clubbing, cyanosis or edema. Neuro:   Grossly intact.  Diagnositics/Labs: Spirometry: FEV1: 2.04 L or 54%, FVC: 3.73 L 81%, ratio consistent with obstructive pattern.  Assessment and plan: Food allergy  -continue avoidance of fish and shellfish -if you become interested in determining if you are not fish allergic then would recommend you have serum IgE levels for fish/shellfish done.  This is labwork and you can return to our [place] office during office hours to  lab draw.   -have access to self-injectable epinephrine (Epipen or AuviQ) 0.3mg  at all times -follow emergency action plan in case of allergic reaction  Moderate persistent asthma, not well-controlled -Breo inhaler looks to be covered with insurance.   Start Breo 1 puff once a day.  This is similar to Symbicort but is a powder inhaler.  It is a asthma controller inhaler -have access to albuterol  inhaler 2 puffs or albuterol 1 vial via nebulizer every 4-6 hours as needed for cough/wheeze/shortness of breath/chest tightness.  May use 15-20 minutes prior to activity.   Monitor frequency of use.   -nebulizer provided today -Will need to discuss potential of a biologic asthma agent  Asthma control goals:  Full participation in all desired activities (may need albuterol before activity) Albuterol use two time or less a week on average (not counting use with activity) Cough interfering with sleep two time or less a month Oral steroids no more than once a year No hospitalizations  Eczema -continue moisturization daily and especially after bathing with lotion like Eucerin -recommend applying sunscreen for sun protection  Follow-up in 4-6 months or sooner if needed    I appreciate the opportunity to take part in Sante's care. Please do not hesitate to contact me with questions.  Sincerely,   [name], MD Allergy/Immunology [facility]

## 2022-11-15 ENCOUNTER — Encounter (INDEPENDENT_AMBULATORY_CARE_PROVIDER_SITE_OTHER): Payer: Self-pay

## 2022-11-30 ENCOUNTER — Encounter (HOSPITAL_COMMUNITY): Payer: Self-pay

## 2022-11-30 ENCOUNTER — Ambulatory Visit (HOSPITAL_COMMUNITY)
Admission: EM | Admit: 2022-11-30 | Discharge: 2022-11-30 | Disposition: A | Discharge status: Home / Self Care | Payer: BC Managed Care – PPO | Attending: Family Medicine | Admitting: Family Medicine

## 2022-11-30 DIAGNOSIS — J4521 Mild intermittent asthma with (acute) exacerbation: Secondary | ICD-10-CM | POA: Diagnosis not present

## 2022-11-30 MED ORDER — IPRATROPIUM-ALBUTEROL 0.5-2.5 (3) MG/3ML IN SOLN
3.0000 mL | Freq: Once | RESPIRATORY_TRACT | Status: AC
  Administered 2022-11-30: 3 mL via RESPIRATORY_TRACT

## 2022-11-30 MED ORDER — IPRATROPIUM-ALBUTEROL 0.5-2.5 (3) MG/3ML IN SOLN
RESPIRATORY_TRACT | Status: AC
  Filled 2022-11-30: qty 3

## 2022-11-30 MED ORDER — METHYLPREDNISOLONE 4 MG PO TBPK: ORAL_TABLET | ORAL | 0 refills | Status: DC

## 2022-11-30 NOTE — Discharge Instructions (Signed)
You were seen today for asthma exacerbation.  You were given a nebulizer treatment today.  I have sent out a steroid to the pharmacy as well.   Please follow up if not improving or worsening.

## 2022-11-30 NOTE — ED Triage Notes (Signed)
Chest Tightness- Asthma History. Onset of symptoms few night ago. Patient having SOB, no cough.   Patient tried his albuterol inhaler with slight relief.

## 2022-11-30 NOTE — ED Provider Notes (Signed)
[facility]    CSN: [id number] Arrival date & time: 11/30/22  1049      History   Chief Complaint Chief Complaint  Patient presents with   Asthma   Chest Pain    HPI William Zimmerman is a 37 y.o. male.    Asthma Associated symptoms include chest pain and shortness of breath.  Chest Pain Associated symptoms: shortness of breath    Patient is here for chest tightness x several days, chest feels congested.  No coughing.  This feels similar to his asthma.  He did try his albuterol inhaler with a bit of help, but then this flared up.  No runny nose, congestion, drainage.  No fevers/chills  He was in the ER 08/2022 for similar symptoms.  Given neb treatment and steroid with help.  He is requesting a neb treatment today.       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 No pertinent surgical history.     Home Medications    Prior to Admission medications   Medication Sig Start Date End Date Taking? Authorizing Provider  amLODipine (NORVASC) 5 MG tablet Take 1 tablet (5 mg total) by mouth daily. 06/11/22  Yes Seabron Spates R, DO  fluticasone furoate-vilanterol (BREO ELLIPTA) 200-25 MCG/ACT AEPB Inhale 1 puff into the lungs daily. 08/29/22  Yes Padgett, Pilar Grammes, MD  levalbuterol [facility] HFA) 45 MCG/ACT inhaler Inhale 2 puffs into the lungs every 6 (six) hours as needed for wheezing or shortness of breath. 07/16/22  Yes Seabron Spates R, DO  losartan (COZAAR) 50 MG  tablet Take 1 tablet (50 mg total) by mouth daily. 06/11/22  Yes Seabron Spates R, DO  albuterol (PROVENTIL) (2.5 MG/3ML) 0.083% nebulizer solution Take 3 mLs (2.5 mg total) by nebulization every 4 (four) hours as needed for wheezing or shortness of breath. 08/29/22   Padgett, Pilar Grammes, MD    Family History Family History  Problem Relation Age of Onset   Diabetes Mother    Arthritis Mother    Hypertension Mother    Hypertension Father    Diabetes Maternal Grandfather    Arthritis Maternal Grandfather     Social History Social History   Tobacco Use   Smoking status: Never    Passive exposure: Never   Smokeless tobacco: Never   Tobacco comments:    Daily marijuana use   Vaping Use   Vaping status: Never Used  Substance Use Topics   Alcohol use: Yes    Alcohol/week: 4.0 standard drinks of alcohol    Types: 4 Cans of beer per week    Comment: uses on the weekends only   Drug use: Not Currently    Types: Marijuana    Comment: Stopped 05/2021     Allergies   Bee pollen and Shellfish allergy   Review of Systems Review of Systems  Constitutional: Negative.   HENT: Negative.    Respiratory:  Positive for chest tightness and shortness of breath.   Cardiovascular:  Positive  for chest pain.  Genitourinary: Negative.   Musculoskeletal: Negative.   Psychiatric/Behavioral: Negative.       Physical Exam Triage Vital Signs ED Triage Vitals  Encounter Vitals Group     BP 11/30/22 1122 (!) 151/89     Systolic BP Percentile --      Diastolic BP Percentile --      Pulse Rate 11/30/22 1122 (!) 59     Resp 11/30/22 1122 20     Temp 11/30/22 1122 98.3 F (36.8 C)     Temp Source 11/30/22 1122 Oral     SpO2 11/30/22 1122 95 %     Weight 11/30/22 1122 235 lb (106.6 kg)     Height 11/30/22 1122 5\' 11"  (1.803 m)     Head Circumference --      Peak Flow --      Pain Score 11/30/22 1121 5     Pain Loc --      Pain Education --      Exclude from Growth Chart --     No data found.  Updated Vital Signs BP (!) 151/89 (BP Location: Left Arm) Comment: without bp meds  Pulse (!) 59   Temp 98.3 F (36.8 C) (Oral)   Resp 20   Ht 5\' 11"  (1.803 m)   Wt 106.6 kg   SpO2 95%   BMI 32.78 kg/m   Visual Acuity Right Eye Distance:   Left Eye Distance:   Bilateral Distance:    Right Eye Near:   Left Eye Near:    Bilateral Near:     Physical Exam Constitutional:      Appearance: He is well-developed and normal weight.  Cardiovascular:     Rate and Rhythm: Normal rate and regular rhythm.     Heart sounds: Normal heart sounds.  Pulmonary:     Effort: Pulmonary effort is normal.     Breath sounds: Normal breath sounds.  Musculoskeletal:        General: Normal range of motion.     Cervical back: Normal range of motion.  Skin:    General: Skin is warm.  Neurological:     General: No focal deficit present.     Mental Status: He is alert.      UC Treatments / Results  Labs (all labs ordered are listed, but only abnormal results are displayed) Labs Reviewed - No data to display  EKG   Radiology No results found.  Procedures Procedures (including critical care time)  Medications Ordered in UC Medications  ipratropium-albuterol (DUONEB) 0.5-2.5 (3) MG/3ML nebulizer solution 3 mL (has no administration in time range)    Initial Impression / Assessment and Plan / UC Course  I have reviewed the triage vital signs and the nursing notes.  Pertinent labs & imaging results that were available during my care of the patient were reviewed by me and considered in my medical decision making (see chart for details).  Final Clinical Impressions(s) / UC Diagnoses   Final diagnoses:  Mild intermittent asthma with exacerbation     Discharge Instructions      You were seen today for asthma exacerbation.  You were given a nebulizer treatment today.  I have sent out a steroid to the pharmacy as well.   Please follow up if not improving or  worsening.     ED Prescriptions     Medication Sig Dispense Auth. Provider   methylPREDNISolone (MEDROL DOSEPAK) 4 MG TBPK tablet Take as directed 1 each Diavian Furgason,  Denny Peon, MD      PDMP not reviewed this encounter.   Jannifer Franklin, MD 11/30/22 1159

## 2022-12-10 ENCOUNTER — Encounter: Payer: Self-pay | Admitting: Family Medicine

## 2022-12-10 ENCOUNTER — Ambulatory Visit: Payer: BC Managed Care – PPO | Admitting: Family Medicine

## 2022-12-10 VITALS — BP 120/88 | HR 88 | Temp 98.5°F | Resp 18 | Ht 71.0 in | Wt 224.0 lb

## 2022-12-10 DIAGNOSIS — E785 Hyperlipidemia, unspecified: Secondary | ICD-10-CM

## 2022-12-10 DIAGNOSIS — I10 Essential (primary) hypertension: Secondary | ICD-10-CM | POA: Diagnosis not present

## 2022-12-10 DIAGNOSIS — J4520 Mild intermittent asthma, uncomplicated: Secondary | ICD-10-CM

## 2022-12-10 LAB — CBC WITH DIFFERENTIAL/PLATELET
Basophils Absolute: 0 10*3/uL (ref 0.0–0.1)
Basophils Relative: 0.5 % (ref 0.0–3.0)
Eosinophils Absolute: 0 10*3/uL (ref 0.0–0.7)
Eosinophils Relative: 0.3 % (ref 0.0–5.0)
HCT: 48.1 % (ref 39.0–52.0)
Hemoglobin: 15.7 g/dL (ref 13.0–17.0)
Lymphocytes Relative: 16.9 % (ref 12.0–46.0)
Lymphs Abs: 1.1 10*3/uL (ref 0.7–4.0)
MCHC: 32.6 g/dL (ref 30.0–36.0)
MCV: 84.5 fl (ref 78.0–100.0)
Monocytes Absolute: 0.3 10*3/uL (ref 0.1–1.0)
Monocytes Relative: 4.3 % (ref 3.0–12.0)
Neutro Abs: 5.2 10*3/uL (ref 1.4–7.7)
Neutrophils Relative %: 78 % — ABNORMAL HIGH (ref 43.0–77.0)
Platelets: 311 10*3/uL (ref 150.0–400.0)
RBC: 5.69 Mil/uL (ref 4.22–5.81)
RDW: 14 % (ref 11.5–15.5)
WBC: 6.6 10*3/uL (ref 4.0–10.5)

## 2022-12-10 LAB — COMPREHENSIVE METABOLIC PANEL
ALT: 35 U/L (ref 0–53)
AST: 40 U/L — ABNORMAL HIGH (ref 0–37)
Albumin: 4.7 g/dL (ref 3.5–5.2)
Alkaline Phosphatase: 66 U/L (ref 39–117)
BUN: 21 mg/dL (ref 6–23)
CO2: 28 meq/L (ref 19–32)
Calcium: 9.9 mg/dL (ref 8.4–10.5)
Chloride: 101 meq/L (ref 96–112)
Creatinine, Ser: 1.12 mg/dL (ref 0.40–1.50)
GFR: 83.14 mL/min (ref >60.00)
Glucose, Bld: 95 mg/dL (ref 70–99)
Potassium: 4.2 meq/L (ref 3.5–5.1)
Sodium: 137 meq/L (ref 135–145)
Total Bilirubin: 1 mg/dL (ref 0.2–1.2)
Total Protein: 7.9 g/dL (ref 6.0–8.3)

## 2022-12-10 LAB — LIPID PANEL
Cholesterol: 208 mg/dL — ABNORMAL HIGH (ref 0–200)
HDL: 90.5 mg/dL (ref >39.00)
LDL Cholesterol: 97 mg/dL (ref 0–99)
NonHDL: 117.41
Total CHOL/HDL Ratio: 2
Triglycerides: 101 mg/dL (ref 0.0–149.0)
VLDL: 20.2 mg/dL (ref 0.0–40.0)

## 2022-12-10 LAB — TSH: TSH: 0.22 u[IU]/mL — ABNORMAL LOW (ref 0.35–5.50)

## 2022-12-10 MED ORDER — ALBUTEROL SULFATE (2.5 MG/3ML) 0.083% IN NEBU
2.5000 mg | INHALATION_SOLUTION | RESPIRATORY_TRACT | 1 refills | Status: DC | PRN
Start: 2022-12-10 — End: 2022-12-10

## 2022-12-10 MED ORDER — FLUTICASONE-SALMETEROL 250-50 MCG/ACT IN AEPB
1.0000 | INHALATION_SPRAY | Freq: Two times a day (BID) | RESPIRATORY_TRACT | 3 refills | Status: DC
Start: 2022-12-10 — End: 2024-01-20

## 2022-12-10 MED ORDER — LEVALBUTEROL TARTRATE 45 MCG/ACT IN AERO
2.0000 | INHALATION_SPRAY | Freq: Four times a day (QID) | RESPIRATORY_TRACT | 5 refills | Status: DC | PRN
Start: 2022-12-10 — End: 2023-06-25

## 2022-12-10 NOTE — Progress Notes (Signed)
Established Patient Office Visit  Subjective   Patient ID: William Zimmerman, male    DOB: 03-22-1986  Age: 37 y.o. MRN: [id number]  Chief Complaint  Patient presents with   Hypertension   Follow-up    HPI Discussed the use of AI scribe software for clinical note transcription with the patient, who gave verbal consent to proceed.  History of Present Illness   The patient, with a history of hypertension and asthma, presents for a routine follow-up. He reports that his asthma was well controlled on Breo, which he preferred over other inhalers due to its powdered form. However, he has not been able to continue Breo due to insurance coverage issues. He has been using albuterol daily and reports a need for a refill. He also expresses a need for an alternative to Virgel Bouquet that is covered by his insurance.  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 He expresses a desire to extend the frequency of these physicals from annually to biennially, as his blood pressure has been well controlled on his current medication. He also mentions a concern about the type of blood pressure monitor used during these physicals, as he believes the automatic monitors may give higher readings.      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 Social History   Tobacco Use   Smoking status: Never    Passive exposure: Never   Smokeless tobacco: Never   Tobacco comments:     Daily marijuana use   Vaping Use   Vaping status: Never Used  Substance Use Topics   Alcohol use: Yes    Alcohol/week: 4.0 standard drinks of alcohol    Types: 4 Cans of beer per week    Comment: uses on the weekends only   Drug use: Not Currently    Types: Marijuana    Comment: Stopped 05/2021   Social History   Socioeconomic History   Marital status: Married    Spouse name: Not on file   Number of children: Not on file   Years of education: Not on file   Highest education level: Not on file  Occupational History   Not on file  Tobacco Use   Smoking status: Never    Passive exposure: Never   Smokeless tobacco: Never   Tobacco comments:    Daily marijuana use   Vaping Use   Vaping status: Never Used  Substance and Sexual Activity   Alcohol use: Yes    Alcohol/week: 4.0 standard drinks of alcohol    Types: 4 Cans of beer per week    Comment: uses on the weekends only   Drug use: Not Currently    Types: Marijuana    Comment: Stopped 05/2021   Sexual activity: Yes    Partners: Female  Other Topics Concern   Not on file  Social History Narrative   Exercise 3 days a week    Social Determinants of Health   Financial Resource Strain: Not on file  Food Insecurity: Not on file  Transportation Needs: Not on file  Physical Activity: Not on file  Stress: Not on file  Social Connections: Not on file  Intimate Partner Violence: Not on file   Family Status  Relation Name Status   Mother  Alive   Father  Alive   MGF  (Not Specified)  No partnership data on file   Family History  Problem Relation Age of Onset   Diabetes Mother    Arthritis Mother    Hypertension Mother    Hypertension Father    Diabetes Maternal Grandfather    Arthritis Maternal Grandfather    Allergies  Allergen Reactions   Bee Pollen Shortness Of Breath   Shellfish Allergy Anaphylaxis      ROS    Objective:     BP 120/88 (BP Location: Left Arm, Patient Position: Sitting, Cuff Size:  Large)   Pulse 88   Temp 98.5 F (36.9 C) (Oral)   Resp 18   Ht 5\' 11"  (1.803 m)   Wt 224 lb (101.6 kg)   SpO2 97%   BMI 31.24 kg/m  BP Readings from Last 3 Encounters:  12/10/22 120/88  11/30/22 (!) 151/89  08/29/22 130/70   Wt Readings from Last 3 Encounters:  12/10/22 224 lb (101.6 kg)  11/30/22 235 lb (106.6 kg)  08/29/22 244 lb 14.4 oz (111.1 kg)   SpO2 Readings from Last 3 Encounters:  12/10/22 97%  11/30/22 95%  08/29/22 96%      Physical Exam   Results for orders placed or performed in visit on 12/10/22  CBC with Differential/Platelet  Result Value Ref Range   WBC 6.6 4.0 - 10.5 K/uL   RBC 5.69 4.22 - 5.81 Mil/uL   Hemoglobin 15.7 13.0 - 17.0 g/dL   HCT 40.9 81.1 - 91.4 %   MCV 84.5 78.0 - 100.0 fl   MCHC 32.6 30.0 - 36.0 g/dL   RDW 78.2 95.6 - 21.3 %   Platelets 311.0 150.0 - 400.0 K/uL   Neutrophils Relative % 78.0 (H) 43.0 - 77.0 %   Lymphocytes Relative 16.9 12.0 - 46.0 %   Monocytes Relative 4.3 3.0 - 12.0 %   Eosinophils Relative 0.3 0.0 - 5.0 %   Basophils Relative 0.5 0.0 - 3.0 %   Neutro Abs 5.2 1.4 - 7.7 K/uL   Lymphs Abs 1.1 0.7 - 4.0 K/uL   Monocytes Absolute 0.3 0.1 - 1.0 K/uL   Eosinophils Absolute 0.0 0.0 - 0.7 K/uL   Basophils Absolute 0.0 0.0 - 0.1 K/uL  Comprehensive metabolic panel  Result Value Ref Range   Sodium 137 135 - 145 mEq/L   Potassium 4.2 3.5 - 5.1 mEq/L   Chloride 101 96 - 112 mEq/L   CO2 28 19 - 32 mEq/L   Glucose, Bld 95 70 - 99 mg/dL   BUN 21 6 - 23 mg/dL   Creatinine, Ser 0.86 0.40 - 1.50 mg/dL   Total Bilirubin 1.0 0.2 - 1.2 mg/dL   Alkaline Phosphatase 66 39 - 117 U/L   AST 40 (H) 0 - 37 U/L   ALT 35 0 - 53 U/L   Total Protein 7.9 6.0 - 8.3 g/dL   Albumin 4.7 3.5 - 5.2 g/dL   GFR 57.84 >69.62 mL/min   Calcium 9.9 8.4 - 10.5 mg/dL  Lipid panel  Result Value Ref Range   Cholesterol 208 (H) 0 - 200 mg/dL   Triglycerides 952.8 0.0 - 149.0 mg/dL   HDL 41.32 >44.01  mg/dL   VLDL 78.2 0.0 - 95.6 mg/dL   LDL  Cholesterol 97 0 - 99 mg/dL   Total CHOL/HDL Ratio 2    NonHDL 117.41   TSH  Result Value Ref Range   TSH 0.22 (L) 0.35 - 5.50 uIU/mL    Last CBC Lab Results  Component Value Date   WBC 6.6 12/10/2022   HGB 15.7 12/10/2022   HCT 48.1 12/10/2022   MCV 84.5 12/10/2022   MCH 27.1 07/05/2019   RDW 14.0 12/10/2022   PLT 311.0 12/10/2022   Last metabolic panel Lab Results  Component Value Date   GLUCOSE 95 12/10/2022   NA 137 12/10/2022   K 4.2 12/10/2022   CL 101 12/10/2022   CO2 28 12/10/2022   BUN 21 12/10/2022   CREATININE 1.12 12/10/2022   GFR 83.14 12/10/2022   CALCIUM 9.9 12/10/2022   PROT 7.9 12/10/2022   ALBUMIN 4.7 12/10/2022   BILITOT 1.0 12/10/2022   ALKPHOS 66 12/10/2022   AST 40 (H) 12/10/2022   ALT 35 12/10/2022   ANIONGAP 12 07/05/2019   Last lipids Lab Results  Component Value Date   CHOL 208 (H) 12/10/2022   HDL 90.50 12/10/2022   LDLCALC 97 12/10/2022   TRIG 101.0 12/10/2022   CHOLHDL 2 12/10/2022   Last hemoglobin A1c No results found for: "HGBA1C" Last thyroid functions Lab Results  Component Value Date   TSH 0.22 (L) 12/10/2022   Last vitamin D No results found for: "25OHVITD2", "25OHVITD3", "VD25OH" Last vitamin B12 and Folate No results found for: "VITAMINB12", "FOLATE"    The ASCVD Risk score (Arnett DK, et al., 2019) failed to calculate for the following reasons:   The 2019 ASCVD risk score is only valid for ages 81 to 25    Assessment & Plan:   Problem List Items Addressed This Visit       Unprioritized   Mild intermittent asthma - Primary   Relevant Medications   fluticasone-salmeterol (ADVAIR DISKUS) 250-50 MCG/ACT AEPB   levalbuterol (XOPENEX HFA) 45 MCG/ACT inhaler   Primary hypertension   Relevant Orders   CBC with Differential/Platelet (Completed)   Comprehensive metabolic panel (Completed)   Lipid panel (Completed)   TSH (Completed)   Hyperlipidemia LDL goal <100   Relevant Orders   CBC with  Differential/Platelet (Completed)   Comprehensive metabolic panel (Completed)   Lipid panel (Completed)   TSH (Completed)  Assessment and Plan    Asthma Daily use of albuterol inhaler. Virgel Bouquet was effective but not covered by insurance. Previously used Symbicort and Advair. -Attempt to switch to Advair, check insurance coverage. -Advise to rinse mouth and brush tongue after use to prevent thrush. -Refill albuterol inhaler as needed.  Hypertension Well controlled on current medication. -Continue current blood pressure medication.  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 -Advise patient to discuss with DOT physical provider about possibility of 2-year certification if blood pressure is well controlled on medication.        No follow-ups on file.    Donato Schultz, DO

## 2022-12-12 ENCOUNTER — Other Ambulatory Visit: Payer: Self-pay | Admitting: Family Medicine

## 2022-12-12 DIAGNOSIS — R7989 Other specified abnormal findings of blood chemistry: Secondary | ICD-10-CM

## 2023-03-06 ENCOUNTER — Encounter: Payer: Self-pay | Admitting: Allergy

## 2023-03-06 ENCOUNTER — Other Ambulatory Visit: Payer: Self-pay

## 2023-03-06 ENCOUNTER — Ambulatory Visit (INDEPENDENT_AMBULATORY_CARE_PROVIDER_SITE_OTHER): Payer: BC Managed Care – PPO | Admitting: Allergy

## 2023-03-06 VITALS — BP 144/98 | HR 86 | Temp 98.8°F | Resp 16 | Ht 69.25 in | Wt 222.2 lb

## 2023-03-06 DIAGNOSIS — L2089 Other atopic dermatitis: Secondary | ICD-10-CM | POA: Diagnosis not present

## 2023-03-06 DIAGNOSIS — T7809XD Anaphylactic reaction due to other food products, subsequent encounter: Secondary | ICD-10-CM

## 2023-03-06 DIAGNOSIS — J4540 Moderate persistent asthma, uncomplicated: Secondary | ICD-10-CM | POA: Diagnosis not present

## 2023-03-06 MED ORDER — EPINEPHRINE 0.3 MG/0.3ML IJ SOAJ: 0.3000 mg | INTRAMUSCULAR | 1 refills | Status: AC | PRN

## 2023-03-06 MED ORDER — FLUTICASONE-SALMETEROL 500-50 MCG/ACT IN AEPB: 1.0000 | INHALATION_SPRAY | Freq: Two times a day (BID) | RESPIRATORY_TRACT | 5 refills | Status: DC

## 2023-03-06 NOTE — Patient Instructions (Addendum)
-  continue avoidance of fish and shellfish -if you become interested in determining if you are not fish allergic then would recommend you have serum IgE levels for fish/shellfish done.  This is labwork and you can return to our [place] office during office hours to lab draw.   -have access to self-injectable epinephrine (Epipen or AuviQ) 0.3mg  at all times -follow emergency action plan in case of allergic reaction  -Breo inhaler currently not controlling symptoms well with daily albuterol use Stop Breo for now.  May be able to resume use during warmer weather months.  -Start Advair discus 1 puff twice a day.   -have access to albuterol inhaler 2 puffs or albuterol 1 vial via nebulizer every 4-6 hours as needed for cough/wheeze/shortness of breath/chest tightness.  May use 15-20 minutes prior to activity.   Monitor frequency of use.    Asthma control goals:  Full participation in all desired activities (may need albuterol before activity) Albuterol use two time or less a week on average (not counting use with activity) Cough interfering with sleep two time or less a month Oral steroids no more than once a year No hospitalizations   -continue moisturization daily and especially after bathing with lotion like Eucerin -recommend applying sunscreen for sun protection  Follow-up in 4-6 months or sooner if needed

## 2023-03-06 NOTE — Progress Notes (Signed)
Follow-up Note  RE: William Zimmerman MRN: [id number] DOB: [birth year] Date of Office Visit: 03/06/2023   History of present illness: William Zimmerman is a 37 y.o. male presenting today for follow-up of asthma, eczema and food allergy.  He was last seen in the office on 08/29/2022 by myself.  Discussed the use of AI scribe software for clinical note transcription with the patient, who gave verbal consent to proceed.  He reports no new diagnoses, surgeries, or hospitalizations since the last visit. He has noticed no significant changes in his breathing control over the past six months. However, he has recently quit smoking, which he believes has positively impacted his lung health. Despite this, he has been using his albuterol rescue inhaler daily for the past week or two, coinciding with a change in weather. He describes his symptoms as a slight difficulty in breathing, particularly at night, but denies any chest tightness. The albuterol reportedly helps alleviate these symptoms.  The patient has been using Breo daily for the past six months. He reports one visit to urgent care due to breathing issues during this period but has not needed to use the nebulizer provided during his last visit. Despite the improvement in his breathing since starting Breo, the patient has been needing his rescue inhaler more frequently with the recent weather changes.  From a dermatological perspective, the patient reports no issues with eczema or dry skin, even during colder weather. He applies lotion after showering to maintain skin hydration.   He continues to avoid fish and shellfish due to a known allergy and has not needed to use his EpiPen device.     Review of systems: 10pt ROS negative unless noted above in HPI  All other systems negative unless noted above in HPI  Past medical/social/surgical/family history have been reviewed and are unchanged unless specifically indicated below.  No  changes  Medication List: Current Outpatient Medications  Medication Sig Dispense Refill   albuterol (PROVENTIL) (2.5 MG/3ML) 0.083% nebulizer solution Take 2.5 mg by nebulization as needed for wheezing or shortness of breath.     amLODipine (NORVASC) 5 MG tablet Take 1 tablet (5 mg total) by mouth daily. 90 tablet 3   EPINEPHrine (EPIPEN 2-PAK) 0.3 mg/0.3 mL IJ SOAJ injection Inject 0.3 mg into the muscle as needed for anaphylaxis. 0.3 mL 1   fluticasone-salmeterol (ADVAIR DISKUS) 250-50 MCG/ACT AEPB Inhale 1 puff into the lungs in the morning and at bedtime. 3 each 3   fluticasone-salmeterol (ADVAIR) 500-50 MCG/ACT AEPB Inhale 1 puff into the lungs in the morning and at bedtime. 60 each 5   losartan (COZAAR) 50 MG tablet Take 1 tablet (50 mg total) by mouth daily. 90 tablet 3   levalbuterol (XOPENEX HFA) 45 MCG/ACT inhaler Inhale 2 puffs into the lungs every 6 (six) hours as needed for wheezing or shortness of breath. (Patient not taking: Reported on 03/06/2023) 18 g 5   No current facility-administered medications for this visit.     Known medication allergies: Allergies  Allergen Reactions   Bee Pollen Shortness Of Breath   Shellfish Allergy Anaphylaxis     Physical examination: Blood pressure (!) 144/98, pulse 86, temperature 98.8 F (37.1 C), temperature source Temporal, resp. rate 16, height 5' 9.25" (1.759 m), weight 222 lb 3.2 oz (100.8 kg), SpO2 98%.  General: Alert, interactive, in no acute distress. HEENT: PERRLA, TMs pearly gray, turbinates minimally edematous without discharge, post-pharynx non erythematous. Neck: Supple without lymphadenopathy. Lungs: Clear to auscultation without wheezing, rhonchi  or rales. {no increased work of breathing. CV: Normal S1, S2 without murmurs. Abdomen: Nondistended, nontender. Skin: Warm and dry, without lesions or rashes. Extremities:  No clubbing, cyanosis or edema. Neuro:   Grossly intact.  Diagnositics/Labs: Spirometry: FEV1:  2.46 L 70%, FVC: 3.96 L 92%, ratio consistent with obstructive pattern.  This is an improved study from his previous in May  Assessment and plan: Food allergy  -continue avoidance of fish and shellfish -if you become interested in determining if you are not fish allergic then would recommend you have serum IgE levels for fish/shellfish done.  This is labwork and you can return to our [place] office during office hours to lab draw.   -have access to self-injectable epinephrine (Epipen or AuviQ) 0.3mg  at all times -follow emergency action plan in case of allergic reaction  Moderate persistent asthma -Breo inhaler currently not controlling symptoms well with daily albuterol use Stop Breo for now.  May be able to resume use during warmer weather months.  -Start Advair discus 1 puff twice a day.   -have access to albuterol inhaler 2 puffs or albuterol 1 vial via nebulizer every 4-6 hours as needed for cough/wheeze/shortness of breath/chest tightness.  May use 15-20 minutes prior to activity.   Monitor frequency of use.    Asthma control goals:  Full participation in all desired activities (may need albuterol before activity) Albuterol use two time or less a week on average (not counting use with activity) Cough interfering with sleep two time or less a month Oral steroids no more than once a year No hospitalizations  Eczema -continue moisturization daily and especially after bathing with lotion like Eucerin -recommend applying sunscreen for sun protection  Follow-up in 4-6 months or sooner if needed    I appreciate the opportunity to take part in Mont's care. Please do not hesitate to contact me with questions.  Sincerely,   [name], MD Allergy/Immunology [facility]

## 2023-03-08 NOTE — Addendum Note (Signed)
Addended by: Kellie Simmering, Jowell Bossi on: 03/08/2023 12:15 PM   Modules accepted: Orders

## 2023-06-25 ENCOUNTER — Other Ambulatory Visit: Payer: Self-pay | Admitting: Family Medicine

## 2023-06-25 DIAGNOSIS — J4520 Mild intermittent asthma, uncomplicated: Secondary | ICD-10-CM

## 2023-06-25 MED ORDER — LEVALBUTEROL TARTRATE 45 MCG/ACT IN AERO
2.0000 | INHALATION_SPRAY | Freq: Four times a day (QID) | RESPIRATORY_TRACT | 0 refills | Status: DC | PRN
Start: 2023-06-25 — End: 2023-07-17

## 2023-06-25 NOTE — Telephone Encounter (Signed)
 Copied from CRM [id number]. Topic: Clinical - Medication Refill >> Jun 25, 2023  9:48 AM Florestine Avers wrote: Most Recent Primary Care Visit:  Provider: Seabron Spates R  Department: [facility]  Visit Type: OFFICE VISIT  Date: 12/10/2022  Medication: levalbuterol (XOPENEX HFA) 45 MCG/ACT inhaler   Has the patient contacted their pharmacy? Yes (Agent: If no, request that the patient contact the pharmacy for the refill. If patient does not wish to contact the pharmacy document the reason why and proceed with request.) (Agent: If yes, when and what did the pharmacy advise?)  Is this the correct pharmacy for this prescription? Yes If no, delete pharmacy and type the correct one.  This is the patient's preferred pharmacy:   [phone] [place], [place] - [address] [place] [address] Phone: [phone] Fax: [phone]    Has the prescription been filled recently? No  Is the patient out of the medication? Yes  Has the patient been seen for an appointment in the last year OR does the patient have an upcoming appointment? Yes  Can we respond through MyChart? Yes  Agent: Please be advised that Rx refills may take up to 3 business days. We ask that you follow-up with your pharmacy.

## 2023-07-17 ENCOUNTER — Other Ambulatory Visit: Payer: Self-pay | Admitting: Family Medicine

## 2023-07-17 DIAGNOSIS — J4520 Mild intermittent asthma, uncomplicated: Secondary | ICD-10-CM

## 2023-08-24 ENCOUNTER — Other Ambulatory Visit: Payer: Self-pay | Admitting: Family Medicine

## 2023-08-24 DIAGNOSIS — I10 Essential (primary) hypertension: Secondary | ICD-10-CM

## 2023-09-04 ENCOUNTER — Other Ambulatory Visit: Payer: Self-pay

## 2023-09-04 ENCOUNTER — Ambulatory Visit: Payer: BC Managed Care – PPO | Admitting: Allergy

## 2023-09-04 ENCOUNTER — Encounter: Payer: Self-pay | Admitting: Allergy

## 2023-09-04 VITALS — BP 130/84 | HR 85 | Temp 98.1°F | Resp 19 | Ht 69.75 in | Wt 249.8 lb

## 2023-09-04 DIAGNOSIS — L2089 Other atopic dermatitis: Secondary | ICD-10-CM | POA: Diagnosis not present

## 2023-09-04 DIAGNOSIS — T7809XD Anaphylactic reaction due to other food products, subsequent encounter: Secondary | ICD-10-CM | POA: Diagnosis not present

## 2023-09-04 DIAGNOSIS — J4540 Moderate persistent asthma, uncomplicated: Secondary | ICD-10-CM

## 2023-09-04 MED ORDER — FLUTICASONE-SALMETEROL 500-50 MCG/ACT IN AEPB: 1.0000 | INHALATION_SPRAY | Freq: Two times a day (BID) | RESPIRATORY_TRACT | 5 refills | Status: DC

## 2023-09-04 MED ORDER — TRIAMCINOLONE ACETONIDE 0.1 % EX OINT: 1.0000 | TOPICAL_OINTMENT | Freq: Two times a day (BID) | CUTANEOUS | 0 refills | Status: AC

## 2023-09-04 MED ORDER — NEFFY 2 MG/0.1ML NA SOLN: 1.0000 | NASAL | 2 refills | Status: AC | PRN

## 2023-09-04 NOTE — Progress Notes (Signed)
 Follow-up Note  RE: William Zimmerman MRN: [id number] DOB: 1985-10-26 Date of Office Visit: 09/04/2023   History of present illness: William Zimmerman is a 38 y.o. male presenting today for follow-up of asthma, eczema and food allergy.  He was last seen in the office on 03/06/23 by myself. Discussed the use of AI scribe software for clinical note transcription with the patient, who gave verbal consent to proceed.  He has been using Advair since December, initially at a dose of one puff once a day, although it was recommended to use as one puff twice a day. His breathing has improved with Advair compared to his previous medication, Breo. He has not required urgent care or emergency department visits for his breathing since starting Advair, nor has he needed prednisone  or a steroid shot. No recent issues with breathing that required the use of his rescue inhaler.  He experienced a flare-up of eczema on his arms during a recent trip to the beach, which he managed with over-the-counter Eucerin lotion. He has been using Eucerin for several years.  He continues to avoid fish and shellfish due to allergies. He has not had any reactions to foods at the beach. He mentions consuming processed fish, such as tuna used at [employer] or a similar sub sandwich shop, without any allergic reactions, although he avoids fresh fish.  She does have access to an epinephrine  device.  He feels he has gained some weight and did not feel he blew a lot of air during his recent breathing test.        Review of systems: 10pt ROS negative unless noted above in HPI   Past medical/social/surgical/family history have been reviewed and are unchanged unless specifically indicated below.  No changes  Medication List: Current Outpatient Medications  Medication Sig Dispense Refill   albuterol  (PROVENTIL ) (2.5 MG/3ML) 0.083% nebulizer solution Take 2.5 mg by nebulization as needed for wheezing or shortness of breath.      amLODipine  (NORVASC ) 5 MG tablet TAKE 1 TABLET (5 MG TOTAL) BY MOUTH DAILY. 90 tablet 0   EPINEPHrine  (EPIPEN  2-PAK) 0.3 mg/0.3 mL IJ SOAJ injection Inject 0.3 mg into the muscle as needed for anaphylaxis. 0.3 mL 1   fluticasone -salmeterol (ADVAIR DISKUS) 250-50 MCG/ACT AEPB Inhale 1 puff into the lungs in the morning and at bedtime. 3 each 3   fluticasone -salmeterol (ADVAIR) 500-50 MCG/ACT AEPB Inhale 1 puff into the lungs in the morning and at bedtime. 60 each 5   levalbuterol  (XOPENEX  HFA) 45 MCG/ACT inhaler TAKE 2 PUFFS BY MOUTH EVERY 6 HOURS AS NEEDED FOR WHEEZE OR SHORTNESS OF BREATH 15 each 2   losartan  (COZAAR ) 50 MG tablet Take 1 tablet (50 mg total) by mouth daily. 90 tablet 3   No current facility-administered medications for this visit.     Known medication allergies: Allergies  Allergen Reactions   Bee Pollen Shortness Of Breath   Shellfish Allergy Anaphylaxis     Physical examination: Blood pressure 130/84, pulse 85, temperature 98.1 F (36.7 C), temperature source Temporal, resp. rate 19, height 5' 9.75" (1.772 m), weight 249 lb 12.8 oz (113.3 kg), SpO2 97%.  General: Alert, interactive, in no acute distress. HEENT: PERRLA, TMs pearly gray, turbinates mildly edematous without discharge, post-pharynx non erythematous. Neck: Supple without lymphadenopathy. Lungs: Clear to auscultation without wheezing, rhonchi or rales. {no increased work of breathing. CV: Normal S1, S2 without murmurs. Abdomen: Nondistended, nontender. Skin: Warm and dry, without lesions or rashes. Extremities:  No clubbing, cyanosis  or edema. Neuro:   Grossly intact.  Diagnostics/Labs:  Spirometry: FEV1: 2.06L 59%, FVC: 3.77L 88%, ratio consistent with mod obstructive pattern. This is reduced from last spirometry.  Pt does admit he didn't blow hard.   Assessment and plan: Food allergy  -continue avoidance of fish and shellfish - Have access to self-injectable epinephrine  (Epipen  0.3mg ) OR nasal  epinephrine  (Neffy) at all times.   Discussed Neffy today as an option to have for allergic reactions and will send this prescription to the specialty pharmacy who will contact you regarding coverage.  If not covered then continue access to your Epipen .  -follow emergency action plan in case of allergic reaction  Mod Persistent Asthma -continue Advair 500mcg discus 1 puff twice a day.   -have access to albuterol  inhaler 2 puffs or albuterol  1 vial via nebulizer every 4-6 hours as needed for cough/wheeze/shortness of breath/chest tightness.  May use 15-20 minutes prior to activity.   Monitor frequency of use.    Asthma control goals:  Full participation in all desired activities (may need albuterol  before activity) Albuterol  use two time or less a week on average (not counting use with activity) Cough interfering with sleep two time or less a month Oral steroids no more than once a year No hospitalizations  Eczema -continue moisturization daily and especially after bathing with lotion like Eucerin -recommend applying sunscreen for sun protection -for eczema flares use Triamcinolone  ointment 0.1% twice a day as needed  Follow-up in 4-6 months or sooner if needed    I appreciate the opportunity to take part in Dhiren's care. Please do not hesitate to contact me with questions.  Sincerely,   Catha Clink, MD Allergy/Immunology [facility]

## 2023-09-04 NOTE — Patient Instructions (Addendum)
-  continue avoidance of fish and shellfish - Have access to self-injectable epinephrine  (Epipen  0.3mg ) OR nasal epinephrine  (Neffy) at all times.   Discussed Neffy today as an option to have for allergic reactions and will send this prescription to the specialty pharmacy who will contact you regarding coverage.  If not covered then continue access to your Epipen .  -follow emergency action plan in case of allergic reaction  -continue Advair 500mcg discus 1 puff twice a day.   -have access to albuterol  inhaler 2 puffs or albuterol  1 vial via nebulizer every 4-6 hours as needed for cough/wheeze/shortness of breath/chest tightness.  May use 15-20 minutes prior to activity.   Monitor frequency of use.    Asthma control goals:  Full participation in all desired activities (may need albuterol  before activity) Albuterol  use two time or less a week on average (not counting use with activity) Cough interfering with sleep two time or less a month Oral steroids no more than once a year No hospitalizations   -continue moisturization daily and especially after bathing with lotion like Eucerin -recommend applying sunscreen for sun protection -for eczema flares use Triamcinolone  ointment 0.1% twice a day as needed  Follow-up in 4-6 months or sooner if needed

## 2023-09-26 ENCOUNTER — Other Ambulatory Visit: Payer: Self-pay | Admitting: Family Medicine

## 2023-09-26 DIAGNOSIS — J4520 Mild intermittent asthma, uncomplicated: Secondary | ICD-10-CM

## 2023-11-30 ENCOUNTER — Other Ambulatory Visit: Payer: Self-pay | Admitting: Family Medicine

## 2023-11-30 DIAGNOSIS — I10 Essential (primary) hypertension: Secondary | ICD-10-CM

## 2023-12-07 ENCOUNTER — Other Ambulatory Visit: Payer: Self-pay | Admitting: Family Medicine

## 2023-12-07 DIAGNOSIS — J4520 Mild intermittent asthma, uncomplicated: Secondary | ICD-10-CM

## 2024-01-04 ENCOUNTER — Other Ambulatory Visit: Payer: Self-pay | Admitting: Family Medicine

## 2024-01-04 DIAGNOSIS — I10 Essential (primary) hypertension: Secondary | ICD-10-CM

## 2024-01-08 ENCOUNTER — Other Ambulatory Visit: Payer: Self-pay | Admitting: Family Medicine

## 2024-01-08 ENCOUNTER — Telehealth: Payer: Self-pay | Admitting: Family Medicine

## 2024-01-08 DIAGNOSIS — J4520 Mild intermittent asthma, uncomplicated: Secondary | ICD-10-CM

## 2024-01-08 NOTE — Telephone Encounter (Signed)
Called and scheduled him.

## 2024-01-08 NOTE — Telephone Encounter (Signed)
 Pt overdue for appt. Can you contact Pt please?

## 2024-01-08 NOTE — Telephone Encounter (Unsigned)
 Copied from CRM #[id number]. Topic: Clinical - Medication Refill >> Jan 08, 2024  9:52 AM Frederich PARAS wrote: Medication: levalbuterol  (XOPENEX  HFA) 45 MCG/ACT inhaler  Has the patient contacted their pharmacy? Yes He says it was denied   This is the patient's preferred pharmacy:  [phone] [address], [place] - [address] [place] [address] Phone: [phone] Fax: [phone]  Is this the correct pharmacy for this prescription? Yes If no, delete pharmacy and type the correct one.   Has the prescription been filled recently? Yes  Is the patient out of the medication? Yes, completely out   Has the patient been seen for an appointment in the last year OR does the patient have an upcoming appointment? Yes  Can we respond through MyChart? Yes  Agent: Please be advised that Rx refills may take up to 3 business days. We ask that you follow-up with your pharmacy.

## 2024-01-15 ENCOUNTER — Other Ambulatory Visit: Payer: Self-pay | Admitting: Family Medicine

## 2024-01-15 DIAGNOSIS — J4520 Mild intermittent asthma, uncomplicated: Secondary | ICD-10-CM

## 2024-01-16 ENCOUNTER — Other Ambulatory Visit: Payer: Self-pay | Admitting: Family Medicine

## 2024-01-16 DIAGNOSIS — I10 Essential (primary) hypertension: Secondary | ICD-10-CM

## 2024-01-20 ENCOUNTER — Ambulatory Visit: Admitting: Family Medicine

## 2024-01-20 ENCOUNTER — Encounter: Payer: Self-pay | Admitting: Family Medicine

## 2024-01-20 VITALS — BP 132/82 | HR 93 | Temp 98.0°F | Resp 18 | Ht 69.75 in | Wt 250.4 lb

## 2024-01-20 DIAGNOSIS — J4520 Mild intermittent asthma, uncomplicated: Secondary | ICD-10-CM

## 2024-01-20 DIAGNOSIS — R748 Abnormal levels of other serum enzymes: Secondary | ICD-10-CM

## 2024-01-20 DIAGNOSIS — I10 Essential (primary) hypertension: Secondary | ICD-10-CM

## 2024-01-20 DIAGNOSIS — Z1211 Encounter for screening for malignant neoplasm of colon: Secondary | ICD-10-CM

## 2024-01-20 DIAGNOSIS — R350 Frequency of micturition: Secondary | ICD-10-CM

## 2024-01-20 LAB — POC URINALSYSI DIPSTICK (AUTOMATED)
Bilirubin, UA: NEGATIVE
Blood, UA: NEGATIVE
Glucose, UA: NEGATIVE
Ketones, UA: NEGATIVE
Leukocytes, UA: NEGATIVE
Nitrite, UA: NEGATIVE
Protein, UA: POSITIVE — AB
Spec Grav, UA: 1.015 (ref 1.010–1.025)
Urobilinogen, UA: 0.2 U/dL
pH, UA: 6 (ref 5.0–8.0)

## 2024-01-20 LAB — LIPID PANEL
Cholesterol: 224 mg/dL — ABNORMAL HIGH (ref 0–200)
HDL: 78.2 mg/dL (ref >39.00)
LDL Cholesterol: 122 mg/dL — ABNORMAL HIGH (ref 0–99)
NonHDL: 145.38
Total CHOL/HDL Ratio: 3
Triglycerides: 119 mg/dL (ref 0.0–149.0)
VLDL: 23.8 mg/dL (ref 0.0–40.0)

## 2024-01-20 LAB — COMPREHENSIVE METABOLIC PANEL WITH GFR
ALT: 36 U/L (ref 0–53)
AST: 27 U/L (ref 0–37)
Albumin: 4.6 g/dL (ref 3.5–5.2)
Alkaline Phosphatase: 50 U/L (ref 39–117)
BUN: 15 mg/dL (ref 6–23)
CO2: 29 meq/L (ref 19–32)
Calcium: 9.4 mg/dL (ref 8.4–10.5)
Chloride: 102 meq/L (ref 96–112)
Creatinine, Ser: 1.17 mg/dL (ref 0.40–1.50)
GFR: 78.28 mL/min (ref >60.00)
Glucose, Bld: 91 mg/dL (ref 70–99)
Potassium: 4.4 meq/L (ref 3.5–5.1)
Sodium: 139 meq/L (ref 135–145)
Total Bilirubin: 0.5 mg/dL (ref 0.2–1.2)
Total Protein: 7.2 g/dL (ref 6.0–8.3)

## 2024-01-20 LAB — CBC WITH DIFFERENTIAL/PLATELET
Basophils Absolute: 0.1 K/uL (ref 0.0–0.1)
Basophils Relative: 1.1 % (ref 0.0–3.0)
Eosinophils Absolute: 0.2 K/uL (ref 0.0–0.7)
Eosinophils Relative: 3.2 % (ref 0.0–5.0)
HCT: 48.5 % (ref 39.0–52.0)
Hemoglobin: 15.5 g/dL (ref 13.0–17.0)
Lymphocytes Relative: 36.9 % (ref 12.0–46.0)
Lymphs Abs: 1.7 K/uL (ref 0.7–4.0)
MCHC: 32 g/dL (ref 30.0–36.0)
MCV: 84.6 fl (ref 78.0–100.0)
Monocytes Absolute: 0.4 K/uL (ref 0.1–1.0)
Monocytes Relative: 7.5 % (ref 3.0–12.0)
Neutro Abs: 2.4 K/uL (ref 1.4–7.7)
Neutrophils Relative %: 51.3 % (ref 43.0–77.0)
Platelets: 283 K/uL (ref 150.0–400.0)
RBC: 5.73 Mil/uL (ref 4.22–5.81)
RDW: 14.4 % (ref 11.5–15.5)
WBC: 4.7 K/uL (ref 4.0–10.5)

## 2024-01-20 LAB — GAMMA GT: GGT: 54 U/L — ABNORMAL HIGH (ref 7–51)

## 2024-01-20 LAB — PSA: PSA: 0.66 ng/mL (ref 0.10–4.00)

## 2024-01-20 LAB — TSH: TSH: 0.57 u[IU]/mL (ref 0.35–5.50)

## 2024-01-20 MED ORDER — AMLODIPINE BESYLATE 5 MG PO TABS
5.0000 mg | ORAL_TABLET | Freq: Every day | ORAL | 0 refills | Status: AC
Start: 2024-01-20 — End: ?

## 2024-01-20 MED ORDER — WIXELA INHUB 500-50 MCG/ACT IN AEPB
1.0000 | INHALATION_SPRAY | Freq: Two times a day (BID) | RESPIRATORY_TRACT | 5 refills | Status: AC
Start: 2024-01-20 — End: ?

## 2024-01-20 MED ORDER — LOSARTAN POTASSIUM 100 MG PO TABS
100.0000 mg | ORAL_TABLET | Freq: Every day | ORAL | 1 refills | Status: AC
Start: 2024-01-20 — End: ?

## 2024-01-20 MED ORDER — LEVALBUTEROL TARTRATE 45 MCG/ACT IN AERO: 2.0000 | INHALATION_SPRAY | Freq: Four times a day (QID) | RESPIRATORY_TRACT | 2 refills | Status: DC | PRN

## 2024-01-20 NOTE — Assessment & Plan Note (Signed)
 Well controlled, no changes to meds. Encouraged heart healthy diet such as the DASH diet and exercise as tolerated.

## 2024-01-20 NOTE — Progress Notes (Signed)
 Subjective:    Patient ID: William Zimmerman, male    DOB: 30-Jul-1985, 38 y.o.   MRN: [id number]  Chief Complaint  Patient presents with   Hypertension   Follow-up    HPI Patient is in today for f/u bp.  Discussed the use of AI scribe software for clinical note transcription with the patient, who gave verbal consent to proceed.  History of Present Illness William Zimmerman is a 38 year old male with hypertension and asthma who presents for a follow-up visit.  He has been monitoring his blood pressure at home, noting readings that sometimes reach 130-140/90 mmHg, particularly after consuming alcohol. He uses a digital wrist blood pressure monitor but also has an arm monitor, which he finds less convenient. His blood pressure tends to be higher after drinking alcohol.  Regarding his asthma, he has been out of his rescue inhaler and Advair for about a month due to insurance not covering the Advair, which he finds helpful when available.  He experiences frequent urination at night, which he attributes to his water intake and occasional alcohol consumption. He drinks about 32 ounces of water daily, along with coffee, and gets up multiple times at night to urinate. No burning sensation during urination and urine appears normal.  He mentions a family history of diabetes and hypertension but no known family history of cancer. He is concerned about his prostate and colon health, noting frequent urination at night. No family history of colon cancer and no symptoms such as blood in the stool or diarrhea.  He consumes alcohol primarily on weekends, including beer and some liquor, and acknowledges that his liver function was previously elevated. He is trying to maintain a healthy water intake to counterbalance his alcohol consumption.    Past Medical History:  Diagnosis Date   Asthma    Eczema    Hypertension    Seasonal allergies     No past surgical history on file.  Family History   Problem Relation Age of Onset   Diabetes Mother    Arthritis Mother    Hypertension Mother    Hypertension Father    Diabetes Maternal Grandfather    Arthritis Maternal Grandfather     Social History   Socioeconomic History   Marital status: Married    Spouse name: Not on file   Number of children: Not on file   Years of education: Not on file   Highest education level: Not on file  Occupational History   Not on file  Tobacco Use   Smoking status: Never    Passive exposure: Current   Smokeless tobacco: Never   Tobacco comments:    Daily marijuana use   Vaping Use   Vaping status: Never Used  Substance and Sexual Activity   Alcohol use: Yes    Alcohol/week: 4.0 standard drinks of alcohol    Types: 4 Cans of beer per week    Comment: uses on the weekends only   Drug use: Not Currently    Types: Marijuana    Comment: Stopped 05/2021   Sexual activity: Yes    Partners: Female  Other Topics Concern   Not on file  Social History Narrative   Exercise 3 days a week    Social Drivers of Corporate investment banker Strain: Not on file  Food Insecurity: Not on file  Transportation Needs: Not on file  Physical Activity: Not on file  Stress: Not on file  Social Connections: Not on  file  Intimate Partner Violence: Not on file    Outpatient Medications Prior to Visit  Medication Sig Dispense Refill   albuterol  (PROVENTIL ) (2.5 MG/3ML) 0.083% nebulizer solution Take 3 mLs (2.5 mg total) by nebulization every 4 (four) hours as needed for wheezing or shortness of breath. 75 mL 1   EPINEPHrine  (EPIPEN  2-PAK) 0.3 mg/0.3 mL IJ SOAJ injection Inject 0.3 mg into the muscle as needed for anaphylaxis. 0.3 mL 1   EPINEPHrine  (NEFFY ) 2 MG/0.1ML SOLN Place 1 spray into the nose as needed. 4 each 2   triamcinolone  ointment (KENALOG ) 0.1 % Apply 1 Application topically 2 (two) times daily. 453.6 g 0   amLODipine  (NORVASC ) 5 MG tablet TAKE 1 TABLET (5 MG TOTAL) BY MOUTH DAILY. NEEDS  APPT 90 tablet 0   fluticasone -salmeterol (ADVAIR DISKUS) 250-50 MCG/ACT AEPB Inhale 1 puff into the lungs in the morning and at bedtime. 3 each 3   fluticasone -salmeterol (ADVAIR) 500-50 MCG/ACT AEPB Inhale 1 puff into the lungs in the morning and at bedtime. 60 each 5   levalbuterol  (XOPENEX  HFA) 45 MCG/ACT inhaler Inhale 2 puffs into the lungs every 6 (six) hours as needed for wheezing or shortness of breath. Needs appt 15 g 0   losartan  (COZAAR ) 50 MG tablet Take 1 tablet (50 mg total) by mouth daily. 90 tablet 3   No facility-administered medications prior to visit.    Allergies  Allergen Reactions   Bee Pollen Shortness Of Breath   Shellfish Allergy Anaphylaxis    Review of Systems  Constitutional:  Negative for chills, fever and malaise/fatigue.  HENT:  Negative for congestion and hearing loss.   Eyes:  Negative for discharge.  Respiratory:  Negative for cough, sputum production and shortness of breath.   Cardiovascular:  Negative for chest pain, palpitations and leg swelling.  Gastrointestinal:  Negative for abdominal pain, blood in stool, constipation, diarrhea, heartburn, nausea and vomiting.  Genitourinary:  Negative for dysuria, frequency, hematuria and urgency.  Musculoskeletal:  Negative for back pain, falls and myalgias.  Skin:  Negative for rash.  Neurological:  Negative for dizziness, sensory change, loss of consciousness, weakness and headaches.  Endo/Heme/Allergies:  Negative for environmental allergies. Does not bruise/bleed easily.  Psychiatric/Behavioral:  Negative for depression and suicidal ideas. The patient is not nervous/anxious and does not have insomnia.        Objective:    Physical Exam Vitals and nursing note reviewed.  Constitutional:      General: He is not in acute distress.    Appearance: Normal appearance. He is well-developed.  HENT:     Head: Normocephalic and atraumatic.  Eyes:     General: No scleral icterus.       Right eye: No  discharge.        Left eye: No discharge.  Cardiovascular:     Rate and Rhythm: Normal rate and regular rhythm.     Heart sounds: No murmur heard. Pulmonary:     Effort: Pulmonary effort is normal. No respiratory distress.     Breath sounds: Normal breath sounds.  Musculoskeletal:        General: Normal range of motion.     Cervical back: Normal range of motion and neck supple.     Right lower leg: No edema.     Left lower leg: No edema.  Skin:    General: Skin is warm and dry.  Neurological:     Mental Status: He is alert and oriented to person, place, and time.  Psychiatric:        Mood and Affect: Mood normal.        Behavior: Behavior normal.        Thought Content: Thought content normal.        Judgment: Judgment normal.     BP 132/82 (BP Location: Left Arm, Patient Position: Sitting)   Pulse 93   Temp 98 F (36.7 C) (Oral)   Resp 18   Ht 5' 9.75 (1.772 m)   Wt 250 lb 6.4 oz (113.6 kg)   SpO2 97%   BMI 36.19 kg/m  Wt Readings from Last 3 Encounters:  01/20/24 250 lb 6.4 oz (113.6 kg)  09/04/23 249 lb 12.8 oz (113.3 kg)  03/06/23 222 lb 3.2 oz (100.8 kg)    Diabetic Foot Exam - Simple   No data filed    Lab Results  Component Value Date   WBC 6.6 12/10/2022   HGB 15.7 12/10/2022   HCT 48.1 12/10/2022   PLT 311.0 12/10/2022   GLUCOSE 95 12/10/2022   CHOL 208 (H) 12/10/2022   TRIG 101.0 12/10/2022   HDL 90.50 12/10/2022   LDLCALC 97 12/10/2022   ALT 35 12/10/2022   AST 40 (H) 12/10/2022   NA 137 12/10/2022   K 4.2 12/10/2022   CL 101 12/10/2022   CREATININE 1.12 12/10/2022   BUN 21 12/10/2022   CO2 28 12/10/2022   TSH 0.22 (L) 12/10/2022    Lab Results  Component Value Date   TSH 0.22 (L) 12/10/2022   Lab Results  Component Value Date   WBC 6.6 12/10/2022   HGB 15.7 12/10/2022   HCT 48.1 12/10/2022   MCV 84.5 12/10/2022   PLT 311.0 12/10/2022   Lab Results  Component Value Date   NA 137 12/10/2022   K 4.2 12/10/2022   CO2 28  12/10/2022   GLUCOSE 95 12/10/2022   BUN 21 12/10/2022   CREATININE 1.12 12/10/2022   BILITOT 1.0 12/10/2022   ALKPHOS 66 12/10/2022   AST 40 (H) 12/10/2022   ALT 35 12/10/2022   PROT 7.9 12/10/2022   ALBUMIN 4.7 12/10/2022   CALCIUM 9.9 12/10/2022   ANIONGAP 12 07/05/2019   GFR 83.14 12/10/2022   Lab Results  Component Value Date   CHOL 208 (H) 12/10/2022   Lab Results  Component Value Date   HDL 90.50 12/10/2022   Lab Results  Component Value Date   LDLCALC 97 12/10/2022   Lab Results  Component Value Date   TRIG 101.0 12/10/2022   Lab Results  Component Value Date   CHOLHDL 2 12/10/2022   No results found for: HGBA1C     Assessment & Plan:  Primary hypertension Assessment & Plan: Well controlled, no changes to meds. Encouraged heart healthy diet such as the DASH diet and exercise as tolerated.    Orders: -     amLODIPine  Besylate; Take 1 tablet (5 mg total) by mouth daily. Needs appt  Dispense: 90 tablet; Refill: 0 -     Losartan  Potassium; Take 1 tablet (100 mg total) by mouth daily.  Dispense: 90 tablet; Refill: 1 -     Lipid panel -     PSA -     TSH -     Comprehensive metabolic panel with GFR -     CBC with Differential/Platelet  Mild intermittent asthma, unspecified whether complicated -     Levalbuterol  Tartrate; Inhale 2 puffs into the lungs every 6 (six) hours as needed for wheezing or shortness  of breath. Needs appt  Dispense: 15 g; Refill: 2 -     Wixela Inhub; Inhale 1 puff into the lungs in the morning and at bedtime.  Dispense: 180 each; Refill: 5 -     Lipid panel -     PSA -     TSH -     Comprehensive metabolic panel with GFR -     CBC with Differential/Platelet  Urinary frequency -     POCT Urinalysis Dipstick (Automated)  Elevated liver enzymes -     Gamma GT  Colon cancer screening -     Fecal occult blood, imunochemical; Future   Assessment and Plan Assessment & Plan Mild intermittent asthma   Asthma is currently well  controlled, but the patient has run out of the rescue inhaler and Advair. Insurance issues have prevented refilling Advair, but a generic version should be covered and more affordable. Refill the rescue inhaler and prescribe generic Advair, ensuring insurance coverage. Instruct him to contact via MyChart if there are issues with medication coverage.  Essential hypertension   Blood pressure readings at home are sometimes elevated, particularly after alcohol consumption, with readings around 130-140/90 mmHg. Current management includes losartan , which will be increased slightly to better control blood pressure. Advise reducing alcohol intake to help manage blood pressure and monitor blood pressure regularly at home.  Nocturia   Experiencing frequent urination at night, possibly related to fluid intake and alcohol consumption. No burning or discomfort reported. Family history of diabetes noted, which could contribute to symptoms. Check urine for sugar to rule out diabetes and perform blood work for diabetes indicators.    Rex Magee R Lowne Chase, DO

## 2024-01-26 ENCOUNTER — Ambulatory Visit: Payer: Self-pay | Admitting: Family Medicine

## 2024-04-10 ENCOUNTER — Other Ambulatory Visit: Payer: Self-pay | Admitting: Family Medicine

## 2024-04-10 DIAGNOSIS — J4520 Mild intermittent asthma, uncomplicated: Secondary | ICD-10-CM

## 2024-07-20 ENCOUNTER — Encounter: Admitting: Family Medicine
